# Patient Record
Sex: Female | Born: 1955 | Race: Black or African American | Hispanic: No | Marital: Married | Smoking: Never smoker
Health system: Southern US, Community
[De-identification: ages and names within clinical notes are randomized; demographics above are authoritative.]

## PROBLEM LIST (undated history)

## (undated) DIAGNOSIS — I10 Essential (primary) hypertension: Secondary | ICD-10-CM

## (undated) DIAGNOSIS — Z923 Personal history of irradiation: Secondary | ICD-10-CM

## (undated) DIAGNOSIS — Z9889 Other specified postprocedural states: Secondary | ICD-10-CM

## (undated) DIAGNOSIS — I82409 Acute embolism and thrombosis of unspecified deep veins of unspecified lower extremity: Secondary | ICD-10-CM

## (undated) DIAGNOSIS — R112 Nausea with vomiting, unspecified: Secondary | ICD-10-CM

## (undated) DIAGNOSIS — E7800 Pure hypercholesterolemia, unspecified: Secondary | ICD-10-CM

## (undated) DIAGNOSIS — K219 Gastro-esophageal reflux disease without esophagitis: Secondary | ICD-10-CM

## (undated) DIAGNOSIS — R011 Cardiac murmur, unspecified: Secondary | ICD-10-CM

## (undated) HISTORY — PX: COLONOSCOPY: SHX174

## (undated) HISTORY — PX: APPENDECTOMY: SHX54

## (undated) HISTORY — PX: BREAST LUMPECTOMY: SHX2

---

## 1998-12-13 ENCOUNTER — Observation Stay (HOSPITAL_COMMUNITY): Admission: EM | Admit: 1998-12-13 | Discharge: 1998-12-13 | Payer: Self-pay | Admitting: Emergency Medicine

## 1998-12-14 ENCOUNTER — Encounter: Payer: Self-pay | Admitting: Family Medicine

## 1998-12-15 ENCOUNTER — Encounter: Admission: RE | Admit: 1998-12-15 | Discharge: 1998-12-15 | Payer: Self-pay | Admitting: Family Medicine

## 1999-02-10 ENCOUNTER — Encounter: Payer: Self-pay | Admitting: Family Medicine

## 1999-02-10 ENCOUNTER — Encounter: Admission: RE | Admit: 1999-02-10 | Discharge: 1999-02-10 | Payer: Self-pay | Admitting: Family Medicine

## 2000-07-01 ENCOUNTER — Encounter: Payer: Self-pay | Admitting: Family Medicine

## 2000-07-01 ENCOUNTER — Encounter: Admission: RE | Admit: 2000-07-01 | Discharge: 2000-07-01 | Payer: Self-pay | Admitting: Family Medicine

## 2001-01-19 ENCOUNTER — Encounter: Payer: Self-pay | Admitting: Emergency Medicine

## 2001-01-19 ENCOUNTER — Inpatient Hospital Stay (HOSPITAL_COMMUNITY): Admission: EM | Admit: 2001-01-19 | Discharge: 2001-01-24 | Payer: Self-pay | Admitting: Emergency Medicine

## 2005-10-01 ENCOUNTER — Encounter: Admission: RE | Admit: 2005-10-01 | Discharge: 2005-10-01 | Payer: Self-pay | Admitting: Family Medicine

## 2005-10-11 ENCOUNTER — Encounter: Admission: RE | Admit: 2005-10-11 | Discharge: 2005-10-11 | Payer: Self-pay | Admitting: Family Medicine

## 2006-11-26 ENCOUNTER — Encounter: Admission: RE | Admit: 2006-11-26 | Discharge: 2006-11-26 | Payer: Self-pay | Admitting: Family Medicine

## 2007-03-17 ENCOUNTER — Other Ambulatory Visit: Admission: RE | Admit: 2007-03-17 | Discharge: 2007-03-17 | Payer: Self-pay | Admitting: Family Medicine

## 2008-03-18 ENCOUNTER — Encounter: Admission: RE | Admit: 2008-03-18 | Discharge: 2008-03-18 | Payer: Self-pay | Admitting: Family Medicine

## 2008-11-09 ENCOUNTER — Other Ambulatory Visit: Admission: RE | Admit: 2008-11-09 | Discharge: 2008-11-09 | Payer: Self-pay | Admitting: Family Medicine

## 2009-03-22 ENCOUNTER — Encounter: Admission: RE | Admit: 2009-03-22 | Discharge: 2009-03-22 | Payer: Self-pay | Admitting: Family Medicine

## 2009-12-12 ENCOUNTER — Other Ambulatory Visit: Admission: RE | Admit: 2009-12-12 | Discharge: 2009-12-12 | Payer: Self-pay | Admitting: Family Medicine

## 2010-03-23 ENCOUNTER — Encounter
Admission: RE | Admit: 2010-03-23 | Discharge: 2010-03-23 | Payer: Self-pay | Source: Home / Self Care | Attending: Family Medicine | Admitting: Family Medicine

## 2010-08-18 NOTE — Discharge Summary (Signed)
[place]  Patient:    Amber Jackson, Amber Jackson Visit Number: [id number] MRN: [id number]          Service Type: MED Location: [phone] Attending Physician:  Madaline Guthrie Admit Date:  01/19/2001 Discharge Date: 01/24/2001                             Discharge Summary  ADDENDUM  DISCHARGE LABORATORY DATA:  PT was 21.0, INR was 2.2, PTT was 156.  Sodium 140, potassium 4.0, chloride 106, bicarbonate 28, glucose 111, BUN 3, creatinine 1.4, calcium 9.2.  White blood cell count 8.6, hemoglobin 9.5, hematocrit 29.2, MCV 96.1, platelet count 344. Attending Physician:  Madaline Guthrie DD:  01/24/01 TD:  01/27/01 Job: [id number]

## 2010-08-18 NOTE — Discharge Summary (Signed)
. [facility]  Patient:    Amber Jackson, Amber Jackson Visit Number: [id number] MRN: [id number]          Service Type: MED Location: [phone] Attending Physician:  Madaline Guthrie Dictated by:   Marisue Brooklyn, M.D. Admit Date:  01/19/2001 Discharge Date: 01/24/2001   CC:         Osvaldo Human, M.D.  Dr. Charleston Poot in [place], [place].   Discharge Summary  PATIENT ADDRESS:  [address] [place], [place] [address]  PATIENT TELEPHONE NUMBER:  [phone]  DISCHARGE DIAGNOSES: 1. Bilateral pulmonary emboli. 2. Right femoral deep venous thrombosis.  CONDITION ON DISCHARGE:  Stable.  DISPOSITION:  To home.  FOLLOW-UP:  The patient will be followed up at the [facility] to have a PT and INR checked.  The patient will also be advised to make an appointment with her primary care physician in [place], Dr. Charleston Poot.  The patient must be followed for her Coumadin therapeutic level.  Would recommend checking a creatinine as it was elevated during her hospitalization.  HISTORY OF PRESENT ILLNESS:  The patient is a 55 year old African-American female who presented with a four day history of shortness of breath.  While in the emergency room, a CT scan of her chest was obtained.  CT scan showed bilateral pulmonary emboli.  A CT scan of her upper right thigh showed a superficial femoral DVT.  The patient had had an eight day history of a UTI prior to admission and was treated with Macrobid.  The patient had right CVA tenderness which resolved on Macrobid.  However, the patient continued to have costophrenic pain.  The patient denied recent surgery, travel or medications. Later it was determined that the patient had been taking oral contraceptive pills.  The patients mother has had a history of blood clot.  The patients son had had a clot; however, this was after breaking his ankle.  The patient has  had no leg swelling or erythema.  The patient has had no history of other clots.  The patient does not use alcohol, drugs or smoke.  The patient has no history of cancer and a normal mammogram in January 2002.  The patients last menstrual period was two weeks prior to admission.  HOSPITAL COURSE:  #1 - BILATERAL PULMONARY EMBOLI AND DEEP VENOUS THROMBOSIS:  The patient was treated with unfractionated heparin per pharmacy.  The patients PTT remained therapeutic during her six days of hospitalization.  On the first day of hospitalization, the patient was started on oral Coumadin.  The patients INR gradually evaluated to the day of discharge at 2.2.   The patients shortness of breath was improved during hospitalization.  The patient continued to have mild to moderate pain at her lateral ribs bilaterally.  However, pain was completely treated by Percocet and/or morphine.  #2 - HYPERTENSION:  The patients blood pressure was stable throughout her hospitalization.  The patient was maintained on Atenolol 2.5 mg p.o. q.d.  #3 - ELEVATED CREATININE:  The patients creatinine after the morning of hospitalization rose.  It peaked after day three of hospitalization at 2.0. It was believed this was most likely secondary to contrast nephropathy secondary to her CT obtained on day of admission.  On day three of admission, creatinine started to decrease to 1.7.  At discharge creatinine was 1.4.  DISCHARGE MEDICATIONS: 1. Claritin 10 mg p.o. q.d. 2. Rhinocort as directed. 3. Warfarin 7.5 mg p.o.  q.d. 4. Atenolol 12.5 mg p.o. q.d. 5. Lorazepam 1 mg p.o. q.h.s. p.r.n. insomnia. 6. Percocet 5/325 one to two tablets p.o. q.4-6h. p.r.n. pain.  PROCEDURES:  Chest x-ray on January 19, 2001 showed no evidence of acute cardiopulmonary disease.  An abdominal CT scan obtained on January 19, 2001 showed an unremarkable non-contrast abdomen.  CT scan of the pelvis on January 19, 2001 showed a filling defect in  the right superficial femoral vein which was consistent with a DVT.  Otherwise it was an unremarkable CT of the pelvis. CT scan of the chest on January 19, 2001 showed bilateral filling defects within the segmental branches of almost all pulmonary arterial segments and within the distal aspect of the right main and left main pulmonary arteries consistent with pulmonary emboli.  Minimal dependent and bibasilar atelectasis was noted.  No pleural effusions were noted.  Lawerance Sabal dictating for Marisue Brooklyn, M.D. Dictated by:   Marisue Brooklyn, M.D. Attending Physician:  Madaline Guthrie DD:  01/24/01 TD:  01/27/01 Job: [id number]

## 2010-12-21 ENCOUNTER — Other Ambulatory Visit (HOSPITAL_COMMUNITY)
Admission: RE | Admit: 2010-12-21 | Discharge: 2010-12-21 | Disposition: A | Discharge status: Home / Self Care | Payer: PRIVATE HEALTH INSURANCE | Source: Ambulatory Visit | Attending: Family Medicine | Admitting: Family Medicine

## 2010-12-21 DIAGNOSIS — Z124 Encounter for screening for malignant neoplasm of cervix: Secondary | ICD-10-CM | POA: Insufficient documentation

## 2011-03-12 ENCOUNTER — Other Ambulatory Visit: Payer: Self-pay | Admitting: Family Medicine

## 2011-03-12 DIAGNOSIS — Z1231 Encounter for screening mammogram for malignant neoplasm of breast: Secondary | ICD-10-CM

## 2011-03-30 ENCOUNTER — Inpatient Hospital Stay: Admission: RE | Admit: 2011-03-30 | Payer: PRIVATE HEALTH INSURANCE | Source: Ambulatory Visit

## 2011-04-02 ENCOUNTER — Ambulatory Visit
Admission: RE | Admit: 2011-04-02 | Discharge: 2011-04-02 | Disposition: A | Discharge status: Home / Self Care | Payer: PRIVATE HEALTH INSURANCE | Source: Ambulatory Visit | Attending: Family Medicine | Admitting: Family Medicine

## 2012-03-12 ENCOUNTER — Other Ambulatory Visit: Payer: Self-pay | Admitting: Family Medicine

## 2012-03-12 DIAGNOSIS — Z1231 Encounter for screening mammogram for malignant neoplasm of breast: Secondary | ICD-10-CM

## 2012-04-03 ENCOUNTER — Ambulatory Visit
Admission: RE | Admit: 2012-04-03 | Discharge: 2012-04-03 | Disposition: A | Discharge status: Home / Self Care | Payer: BC Managed Care – PPO | Source: Ambulatory Visit | Attending: Family Medicine | Admitting: Family Medicine

## 2012-04-03 DIAGNOSIS — Z1231 Encounter for screening mammogram for malignant neoplasm of breast: Secondary | ICD-10-CM

## 2013-03-16 ENCOUNTER — Other Ambulatory Visit: Payer: Self-pay

## 2013-03-16 DIAGNOSIS — Z1231 Encounter for screening mammogram for malignant neoplasm of breast: Secondary | ICD-10-CM

## 2013-04-22 ENCOUNTER — Ambulatory Visit
Admission: RE | Admit: 2013-04-22 | Discharge: 2013-04-22 | Disposition: A | Discharge status: Home / Self Care | Payer: BC Managed Care – PPO | Source: Ambulatory Visit

## 2013-04-22 DIAGNOSIS — Z1231 Encounter for screening mammogram for malignant neoplasm of breast: Secondary | ICD-10-CM

## 2014-01-08 ENCOUNTER — Other Ambulatory Visit (HOSPITAL_COMMUNITY)
Admission: RE | Admit: 2014-01-08 | Discharge: 2014-01-08 | Disposition: A | Discharge status: Home / Self Care | Payer: BC Managed Care – PPO | Source: Ambulatory Visit | Attending: Family Medicine | Admitting: Family Medicine

## 2014-01-08 ENCOUNTER — Other Ambulatory Visit: Payer: Self-pay | Admitting: Family Medicine

## 2014-01-08 DIAGNOSIS — Z124 Encounter for screening for malignant neoplasm of cervix: Secondary | ICD-10-CM | POA: Insufficient documentation

## 2014-01-12 LAB — CYTOLOGY - PAP

## 2014-04-07 ENCOUNTER — Other Ambulatory Visit: Payer: Self-pay

## 2014-04-07 DIAGNOSIS — Z1231 Encounter for screening mammogram for malignant neoplasm of breast: Secondary | ICD-10-CM

## 2014-04-23 ENCOUNTER — Ambulatory Visit: Payer: Commercial Managed Care - PPO

## 2014-04-29 ENCOUNTER — Ambulatory Visit
Admission: RE | Admit: 2014-04-29 | Discharge: 2014-04-29 | Disposition: A | Discharge status: Home / Self Care | Payer: BLUE CROSS/BLUE SHIELD | Source: Ambulatory Visit

## 2014-04-29 DIAGNOSIS — Z1231 Encounter for screening mammogram for malignant neoplasm of breast: Secondary | ICD-10-CM

## 2015-04-01 ENCOUNTER — Other Ambulatory Visit: Payer: Self-pay

## 2015-04-01 DIAGNOSIS — Z1231 Encounter for screening mammogram for malignant neoplasm of breast: Secondary | ICD-10-CM

## 2015-05-02 ENCOUNTER — Ambulatory Visit: Payer: BLUE CROSS/BLUE SHIELD

## 2015-05-03 ENCOUNTER — Ambulatory Visit
Admission: RE | Admit: 2015-05-03 | Discharge: 2015-05-03 | Disposition: A | Discharge status: Home / Self Care | Payer: BLUE CROSS/BLUE SHIELD | Source: Ambulatory Visit

## 2015-05-03 DIAGNOSIS — Z1231 Encounter for screening mammogram for malignant neoplasm of breast: Secondary | ICD-10-CM

## 2016-04-03 ENCOUNTER — Other Ambulatory Visit: Payer: Self-pay | Admitting: Family Medicine

## 2016-04-03 DIAGNOSIS — Z1231 Encounter for screening mammogram for malignant neoplasm of breast: Secondary | ICD-10-CM

## 2016-05-04 ENCOUNTER — Ambulatory Visit
Admission: RE | Admit: 2016-05-04 | Discharge: 2016-05-04 | Disposition: A | Discharge status: Home / Self Care | Payer: PRIVATE HEALTH INSURANCE | Source: Ambulatory Visit | Attending: Family Medicine | Admitting: Family Medicine

## 2016-05-04 DIAGNOSIS — Z1231 Encounter for screening mammogram for malignant neoplasm of breast: Secondary | ICD-10-CM

## 2017-01-28 ENCOUNTER — Other Ambulatory Visit (HOSPITAL_COMMUNITY)
Admission: RE | Admit: 2017-01-28 | Discharge: 2017-01-28 | Disposition: A | Discharge status: Home / Self Care | Payer: PRIVATE HEALTH INSURANCE | Source: Ambulatory Visit | Attending: Family Medicine | Admitting: Family Medicine

## 2017-01-28 ENCOUNTER — Other Ambulatory Visit: Payer: Self-pay | Admitting: Family Medicine

## 2017-01-28 DIAGNOSIS — Z01411 Encounter for gynecological examination (general) (routine) with abnormal findings: Secondary | ICD-10-CM | POA: Diagnosis not present

## 2017-01-30 LAB — CYTOLOGY - PAP
ADEQUACY: ABSENT
DIAGNOSIS: NEGATIVE

## 2017-04-05 ENCOUNTER — Other Ambulatory Visit: Payer: Self-pay | Admitting: Family Medicine

## 2017-04-05 DIAGNOSIS — Z139 Encounter for screening, unspecified: Secondary | ICD-10-CM

## 2017-05-06 ENCOUNTER — Ambulatory Visit
Admission: RE | Admit: 2017-05-06 | Discharge: 2017-05-06 | Disposition: A | Discharge status: Home / Self Care | Payer: PRIVATE HEALTH INSURANCE | Source: Ambulatory Visit | Attending: Family Medicine | Admitting: Family Medicine

## 2017-05-06 DIAGNOSIS — Z139 Encounter for screening, unspecified: Secondary | ICD-10-CM

## 2018-04-11 ENCOUNTER — Other Ambulatory Visit: Payer: Self-pay | Admitting: Family Medicine

## 2018-04-11 DIAGNOSIS — Z1231 Encounter for screening mammogram for malignant neoplasm of breast: Secondary | ICD-10-CM

## 2018-05-12 ENCOUNTER — Ambulatory Visit
Admission: RE | Admit: 2018-05-12 | Discharge: 2018-05-12 | Disposition: A | Discharge status: Home / Self Care | Payer: No Typology Code available for payment source | Source: Ambulatory Visit | Attending: Family Medicine | Admitting: Family Medicine

## 2018-05-12 DIAGNOSIS — Z1231 Encounter for screening mammogram for malignant neoplasm of breast: Secondary | ICD-10-CM

## 2019-04-23 ENCOUNTER — Other Ambulatory Visit: Payer: Self-pay | Admitting: Family Medicine

## 2019-04-23 DIAGNOSIS — Z1231 Encounter for screening mammogram for malignant neoplasm of breast: Secondary | ICD-10-CM

## 2019-05-22 ENCOUNTER — Ambulatory Visit: Payer: No Typology Code available for payment source

## 2019-06-23 ENCOUNTER — Ambulatory Visit: Payer: No Typology Code available for payment source

## 2019-08-10 ENCOUNTER — Other Ambulatory Visit: Payer: Self-pay

## 2019-08-10 ENCOUNTER — Ambulatory Visit
Admission: RE | Admit: 2019-08-10 | Discharge: 2019-08-10 | Disposition: A | Discharge status: Home / Self Care | Payer: No Typology Code available for payment source | Source: Ambulatory Visit | Attending: Family Medicine | Admitting: Family Medicine

## 2019-08-10 DIAGNOSIS — Z1231 Encounter for screening mammogram for malignant neoplasm of breast: Secondary | ICD-10-CM

## 2019-10-01 ENCOUNTER — Other Ambulatory Visit (HOSPITAL_COMMUNITY): Payer: Self-pay | Admitting: Family Medicine

## 2019-10-26 ENCOUNTER — Other Ambulatory Visit (HOSPITAL_COMMUNITY): Payer: Self-pay | Admitting: Family Medicine

## 2019-12-01 ENCOUNTER — Other Ambulatory Visit (HOSPITAL_COMMUNITY): Payer: Self-pay | Admitting: Family Medicine

## 2019-12-18 ENCOUNTER — Other Ambulatory Visit (HOSPITAL_COMMUNITY): Payer: Self-pay | Admitting: Family Medicine

## 2020-01-21 ENCOUNTER — Other Ambulatory Visit (HOSPITAL_COMMUNITY): Payer: Self-pay | Admitting: Family Medicine

## 2020-02-23 ENCOUNTER — Other Ambulatory Visit (HOSPITAL_COMMUNITY): Payer: Self-pay | Admitting: Family Medicine

## 2020-04-13 ENCOUNTER — Other Ambulatory Visit (HOSPITAL_COMMUNITY): Payer: Self-pay | Admitting: Internal Medicine

## 2020-04-13 ENCOUNTER — Ambulatory Visit: Payer: No Typology Code available for payment source | Attending: Internal Medicine

## 2020-04-13 DIAGNOSIS — Z23 Encounter for immunization: Secondary | ICD-10-CM

## 2020-04-13 NOTE — Progress Notes (Signed)
   Covid-19 Vaccination Clinic  Name:  CARMINE YOUNGBERG    MRN: [id number] DOB: 08-12-1955  04/13/2020  Ms. Gillispie was observed post Covid-19 immunization for 15 minutes without incident. She was provided with Vaccine Information Sheet and instruction to access the V-Safe system.   Ms. Haaland was instructed to call 911 with any severe reactions post vaccine: Marland Kitchen Difficulty breathing  . Swelling of face and throat  . A fast heartbeat  . A bad rash all over body  . Dizziness and weakness   Immunizations Administered    Name Date Dose VIS Date Route   Pfizer COVID-19 Vaccine 04/13/2020 12:05 PM 0.3 mL 01/20/2020 Intramuscular   Manufacturer: [place]   Lot: [id number]   NDC: [address]-7

## 2020-06-03 ENCOUNTER — Other Ambulatory Visit (HOSPITAL_COMMUNITY): Payer: Self-pay | Admitting: Family Medicine

## 2020-07-01 ENCOUNTER — Other Ambulatory Visit (HOSPITAL_COMMUNITY): Payer: Self-pay | Admitting: Family Medicine

## 2020-07-12 ENCOUNTER — Other Ambulatory Visit (HOSPITAL_COMMUNITY): Payer: Self-pay

## 2020-07-12 MED FILL — Ezetimibe Tab 10 MG: ORAL | 30 days supply | Qty: 30 | Fill #0 | Status: AC

## 2020-07-12 MED FILL — Atenolol Tab 25 MG: ORAL | 90 days supply | Qty: 90 | Fill #0 | Status: AC

## 2020-07-12 MED FILL — Ezetimibe Tab 10 MG: ORAL | 30 days supply | Qty: 30 | Fill #0 | Status: CN

## 2020-07-13 ENCOUNTER — Other Ambulatory Visit (HOSPITAL_COMMUNITY): Payer: Self-pay

## 2020-07-19 ENCOUNTER — Other Ambulatory Visit: Payer: Self-pay | Admitting: Family Medicine

## 2020-07-19 DIAGNOSIS — Z1231 Encounter for screening mammogram for malignant neoplasm of breast: Secondary | ICD-10-CM

## 2020-07-29 ENCOUNTER — Other Ambulatory Visit (HOSPITAL_COMMUNITY): Payer: Self-pay

## 2020-07-29 MED ORDER — HYDROCHLOROTHIAZIDE 12.5 MG PO CAPS
ORAL_CAPSULE | ORAL | 3 refills | Status: DC
  Filled 2020-07-29 – 2020-10-26 (×2): qty 90, 90d supply, fill #0
  Filled 2021-01-24: qty 90, 90d supply, fill #1
  Filled 2021-04-21: qty 90, 90d supply, fill #2

## 2020-08-02 ENCOUNTER — Other Ambulatory Visit (HOSPITAL_COMMUNITY): Payer: Self-pay

## 2020-08-05 ENCOUNTER — Other Ambulatory Visit (HOSPITAL_COMMUNITY): Payer: Self-pay

## 2020-08-11 ENCOUNTER — Other Ambulatory Visit (HOSPITAL_COMMUNITY): Payer: Self-pay

## 2020-08-11 MED FILL — Ezetimibe Tab 10 MG: ORAL | 30 days supply | Qty: 30 | Fill #1 | Status: AC

## 2020-08-26 ENCOUNTER — Other Ambulatory Visit (HOSPITAL_COMMUNITY): Payer: Self-pay

## 2020-08-26 MED FILL — Warfarin Sodium Tab 4 MG: ORAL | 84 days supply | Qty: 145 | Fill #0 | Status: AC

## 2020-08-31 ENCOUNTER — Other Ambulatory Visit (HOSPITAL_COMMUNITY): Payer: Self-pay

## 2020-08-31 MED FILL — Omeprazole Cap Delayed Release 40 MG: ORAL | 60 days supply | Qty: 60 | Fill #0 | Status: AC

## 2020-09-09 ENCOUNTER — Other Ambulatory Visit (HOSPITAL_COMMUNITY): Payer: Self-pay

## 2020-09-09 MED ORDER — EZETIMIBE 10 MG PO TABS
ORAL_TABLET | ORAL | 1 refills | Status: DC
  Filled 2020-09-09: qty 90, 90d supply, fill #0
  Filled 2020-12-06 (×2): qty 90, 90d supply, fill #1
  Filled 2020-12-06: qty 90, 90d supply, fill #0

## 2020-09-15 ENCOUNTER — Ambulatory Visit
Admission: RE | Admit: 2020-09-15 | Discharge: 2020-09-15 | Disposition: A | Discharge status: Home / Self Care | Payer: No Typology Code available for payment source | Source: Ambulatory Visit | Attending: Family Medicine | Admitting: Family Medicine

## 2020-09-15 ENCOUNTER — Other Ambulatory Visit: Payer: Self-pay

## 2020-09-15 DIAGNOSIS — Z1231 Encounter for screening mammogram for malignant neoplasm of breast: Secondary | ICD-10-CM

## 2020-10-07 ENCOUNTER — Other Ambulatory Visit (HOSPITAL_COMMUNITY): Payer: Self-pay

## 2020-10-07 MED ORDER — ATENOLOL 25 MG PO TABS
ORAL_TABLET | ORAL | 3 refills | Status: DC
  Filled 2020-10-07 – 2020-12-28 (×2): qty 90, 90d supply, fill #0
  Filled 2021-03-20: qty 90, 90d supply, fill #1
  Filled 2021-06-28: qty 90, 90d supply, fill #2

## 2020-10-26 ENCOUNTER — Other Ambulatory Visit (HOSPITAL_COMMUNITY): Payer: Self-pay

## 2020-10-26 MED FILL — Omeprazole Cap Delayed Release 40 MG: ORAL | 60 days supply | Qty: 60 | Fill #0 | Status: AC

## 2020-11-23 ENCOUNTER — Other Ambulatory Visit (HOSPITAL_COMMUNITY): Payer: Self-pay

## 2020-11-23 MED FILL — Warfarin Sodium Tab 4 MG: ORAL | 84 days supply | Qty: 145 | Fill #1 | Status: CN

## 2020-11-23 MED FILL — Warfarin Sodium Tab 4 MG: ORAL | 84 days supply | Qty: 145 | Fill #0 | Status: AC

## 2020-11-24 ENCOUNTER — Other Ambulatory Visit (HOSPITAL_COMMUNITY): Payer: Self-pay

## 2020-12-06 ENCOUNTER — Other Ambulatory Visit (HOSPITAL_COMMUNITY): Payer: Self-pay

## 2020-12-07 ENCOUNTER — Other Ambulatory Visit (HOSPITAL_COMMUNITY): Payer: Self-pay

## 2020-12-28 ENCOUNTER — Other Ambulatory Visit (HOSPITAL_COMMUNITY): Payer: Self-pay

## 2020-12-28 MED FILL — Omeprazole Cap Delayed Release 40 MG: ORAL | 60 days supply | Qty: 60 | Fill #1 | Status: AC

## 2021-01-24 ENCOUNTER — Other Ambulatory Visit (HOSPITAL_COMMUNITY): Payer: Self-pay

## 2021-01-24 MED ORDER — CYCLOBENZAPRINE HCL 10 MG PO TABS: 10.0000 mg | ORAL_TABLET | Freq: Every evening | ORAL | 0 refills | Status: DC | PRN

## 2021-01-24 MED ORDER — CYCLOBENZAPRINE HCL 10 MG PO TABS
10.0000 mg | ORAL_TABLET | Freq: Every evening | ORAL | 1 refills | Status: AC | PRN
  Filled 2021-01-24: qty 90, 90d supply, fill #0

## 2021-01-24 MED FILL — Cetirizine HCl Tab 10 MG: ORAL | 90 days supply | Qty: 90 | Fill #0 | Status: CN

## 2021-02-16 ENCOUNTER — Other Ambulatory Visit (HOSPITAL_COMMUNITY): Payer: Self-pay

## 2021-02-16 MED ORDER — WARFARIN SODIUM 4 MG PO TABS
ORAL_TABLET | ORAL | 5 refills | Status: AC
  Filled 2021-02-16: qty 145, 84d supply, fill #0
  Filled 2021-02-16: qty 141, 82d supply, fill #0
  Filled 2021-02-16: qty 4, 2d supply, fill #0
  Filled 2021-05-05: qty 145, 84d supply, fill #0
  Filled 2021-07-28: qty 145, 84d supply, fill #1
  Filled 2021-10-23: qty 145, 84d supply, fill #0
  Filled 2022-01-17: qty 145, 84d supply, fill #1

## 2021-02-17 ENCOUNTER — Other Ambulatory Visit (HOSPITAL_COMMUNITY): Payer: Self-pay

## 2021-02-28 ENCOUNTER — Other Ambulatory Visit (HOSPITAL_COMMUNITY): Payer: Self-pay

## 2021-02-28 MED FILL — Omeprazole Cap Delayed Release 40 MG: ORAL | 60 days supply | Qty: 60 | Fill #2 | Status: AC

## 2021-03-20 ENCOUNTER — Other Ambulatory Visit (HOSPITAL_COMMUNITY): Payer: Self-pay

## 2021-04-02 DIAGNOSIS — C50919 Malignant neoplasm of unspecified site of unspecified female breast: Secondary | ICD-10-CM

## 2021-04-02 HISTORY — DX: Malignant neoplasm of unspecified site of unspecified female breast: C50.919

## 2021-04-09 IMAGING — MG DIGITAL SCREENING BILAT W/ TOMO W/ CAD
6 of 10 series · 6 of 30 positions shown · non-contrast
Comparison: Previous exam(s).

CLINICAL DATA: Screening.

EXAM:
DIGITAL SCREENING BILATERAL MAMMOGRAM WITH TOMO AND CAD

[L MLO synth-2D]
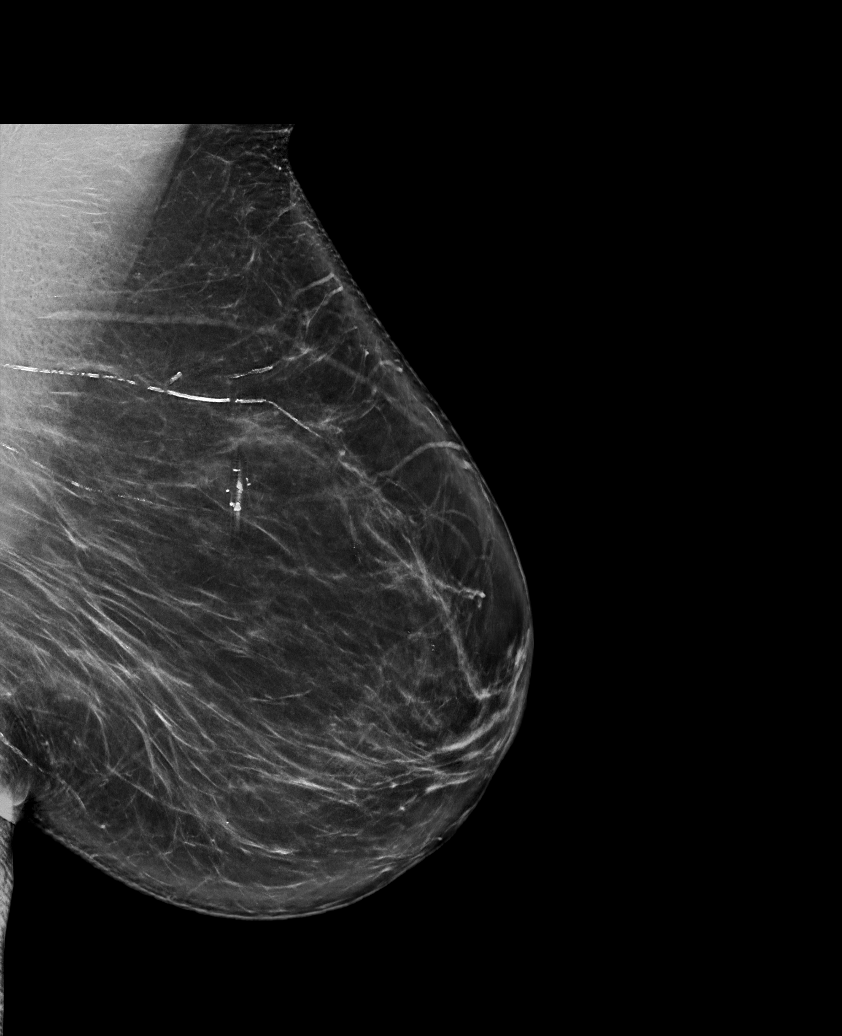

[R CC synth-2D]
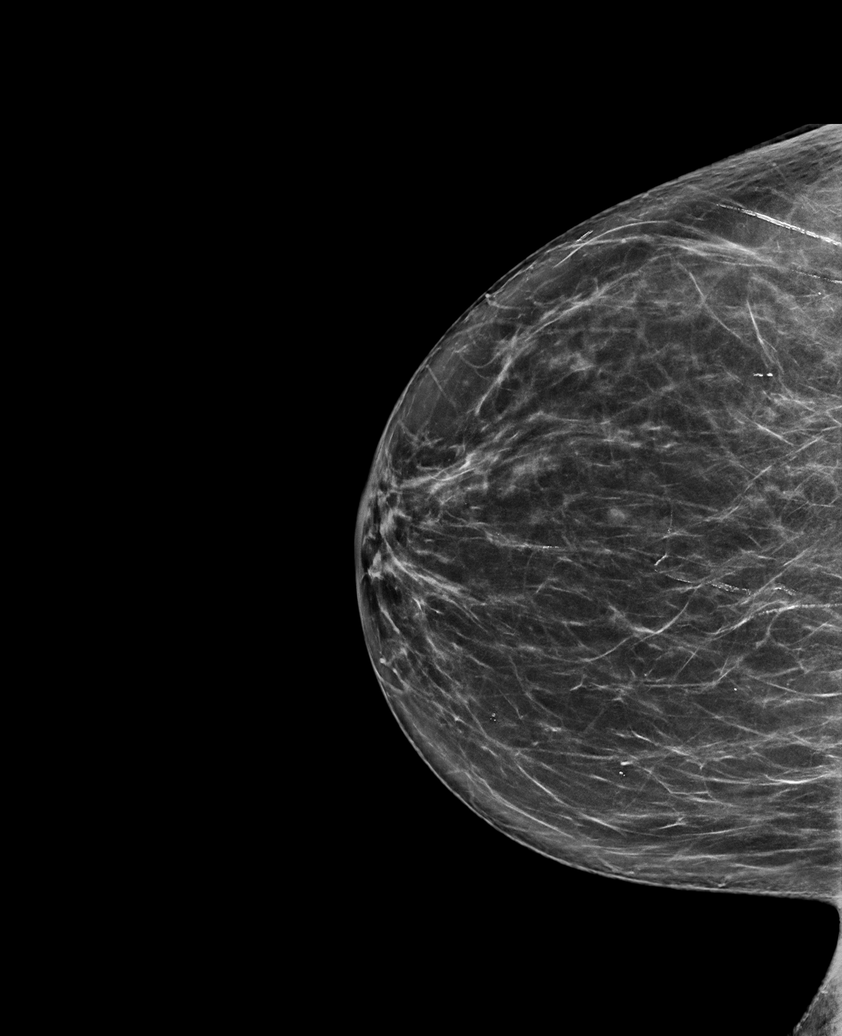

[R MLO synth-2D (1 of 2)]
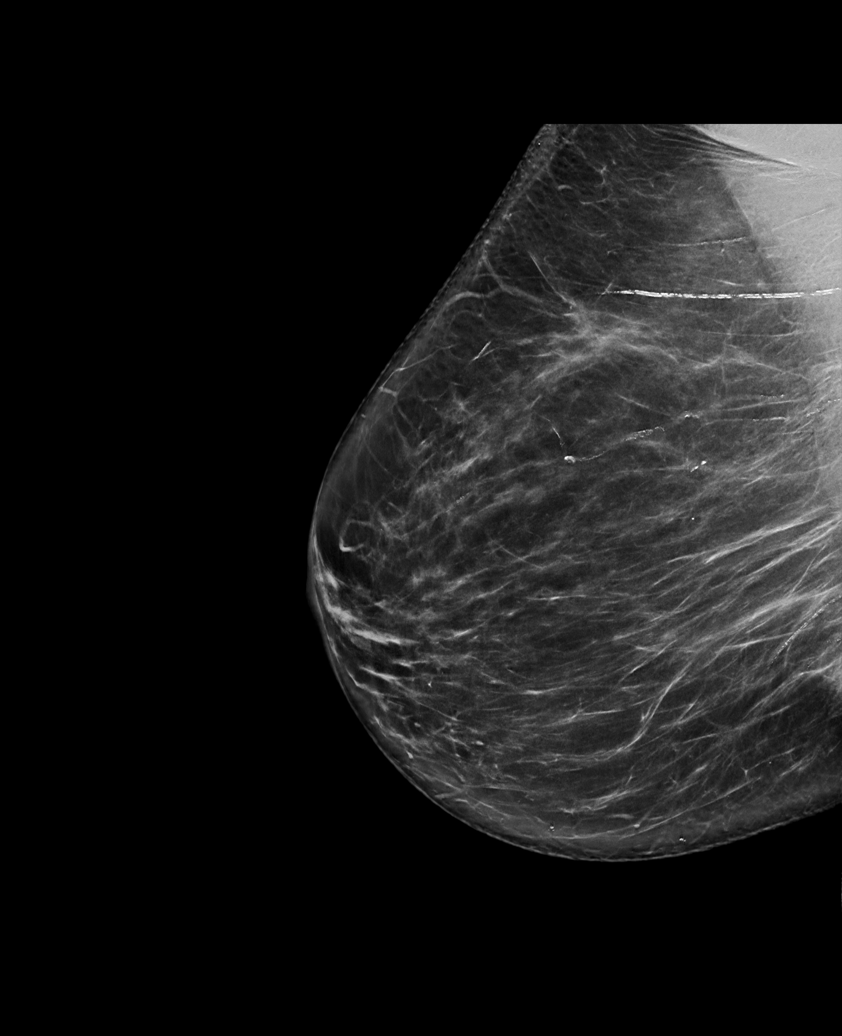

[L CC synth-2D]
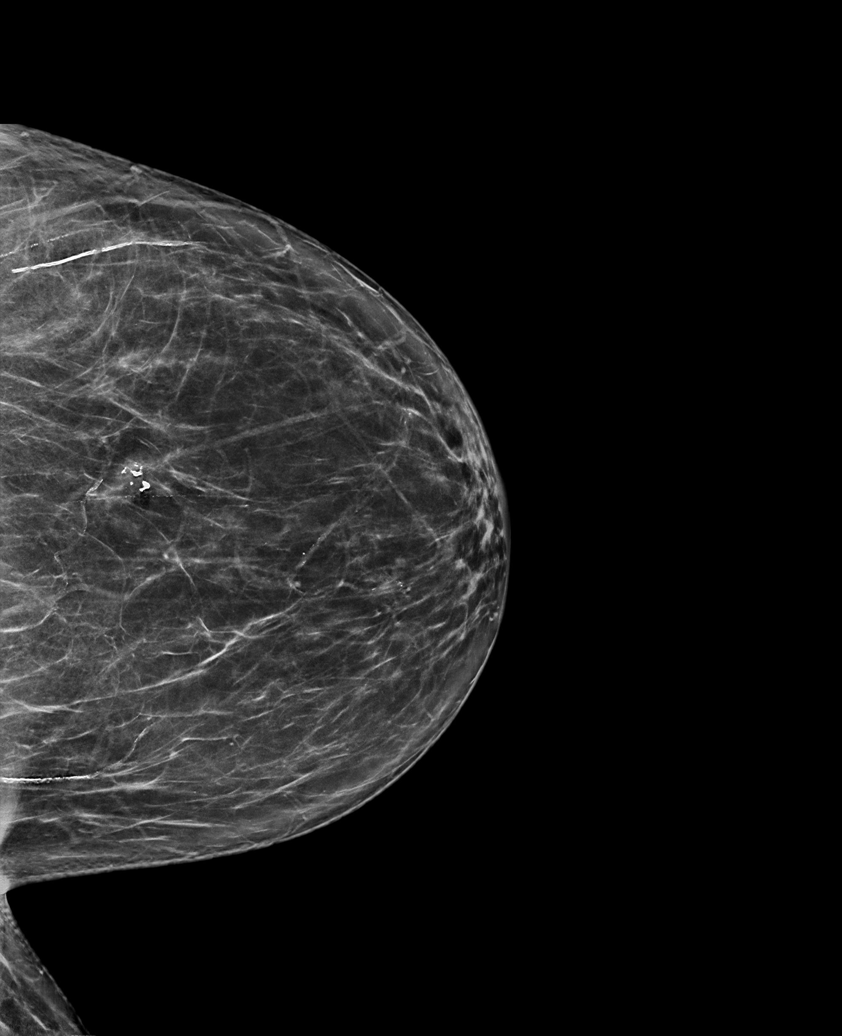

[R MLO synth-2D (2 of 2)]
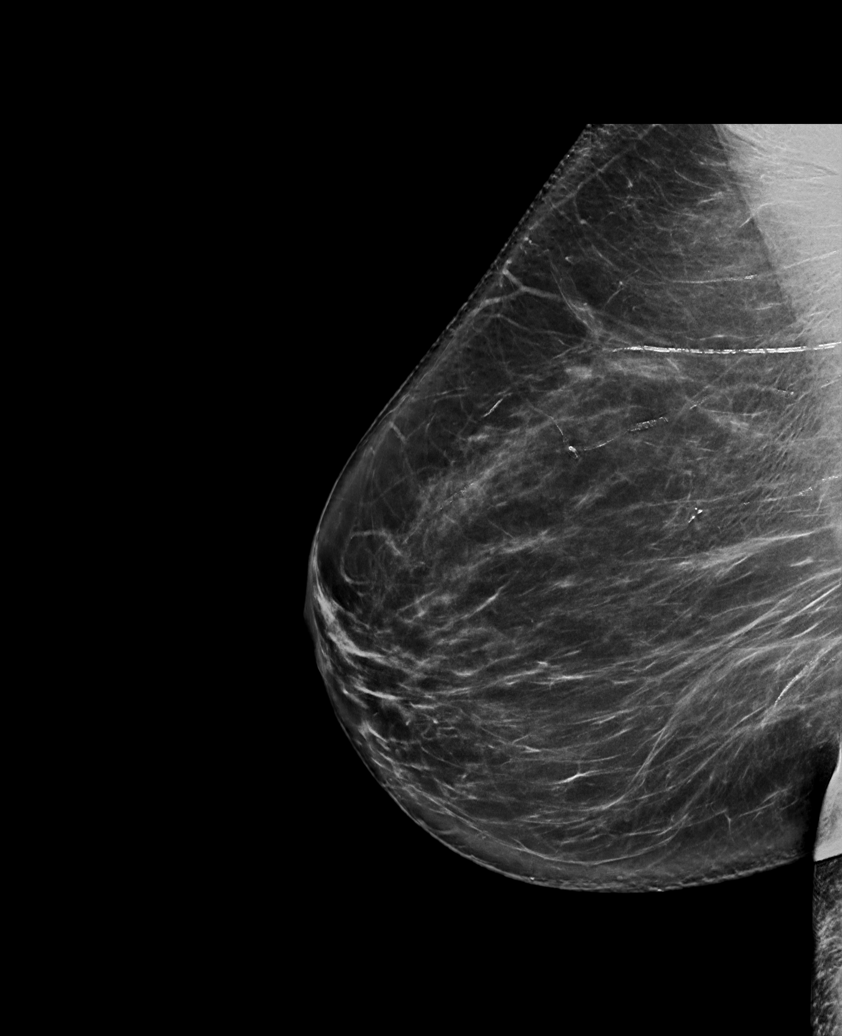

[R CC tomo · tomo slice 39/78.0]
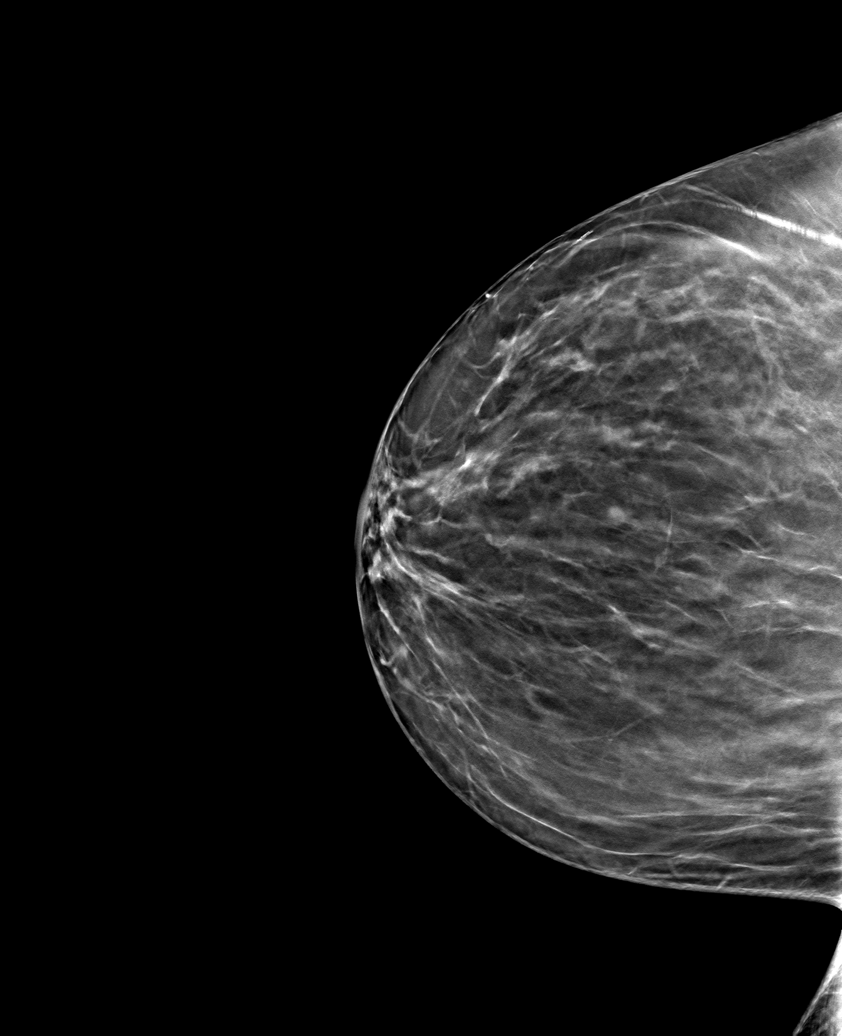

[6 of 30 positions shown; findings below may reference images not displayed]

ACR Breast Density Category b: There are scattered areas of
fibroglandular density.
FINDINGS: There are no findings suspicious for malignancy. Images were
processed with CAD.
IMPRESSION: No mammographic evidence of malignancy. A result letter of this
screening mammogram will be mailed directly to the patient.

RECOMMENDATION:
Screening mammogram in one year. (Code:[id number])

BI-RADS CATEGORY  1: Negative.

## 2021-04-11 ENCOUNTER — Other Ambulatory Visit: Payer: Self-pay

## 2021-04-11 MED ORDER — EZETIMIBE 10 MG PO TABS
ORAL_TABLET | ORAL | 1 refills | Status: DC
  Filled 2021-04-11: qty 90, 90d supply, fill #0
  Filled 2021-06-28: qty 90, 90d supply, fill #1

## 2021-04-21 ENCOUNTER — Other Ambulatory Visit: Payer: Self-pay

## 2021-04-27 ENCOUNTER — Other Ambulatory Visit: Payer: Self-pay

## 2021-04-27 MED FILL — Omeprazole Cap Delayed Release 40 MG: ORAL | 60 days supply | Qty: 60 | Fill #3 | Status: AC

## 2021-05-05 ENCOUNTER — Other Ambulatory Visit: Payer: Self-pay

## 2021-06-28 ENCOUNTER — Other Ambulatory Visit: Payer: Self-pay

## 2021-06-28 MED FILL — Omeprazole Cap Delayed Release 40 MG: ORAL | 60 days supply | Qty: 60 | Fill #4 | Status: AC

## 2021-06-29 ENCOUNTER — Other Ambulatory Visit: Payer: Self-pay

## 2021-07-27 ENCOUNTER — Other Ambulatory Visit: Payer: Self-pay

## 2021-07-27 MED ORDER — HYDROCHLOROTHIAZIDE 12.5 MG PO CAPS
ORAL_CAPSULE | ORAL | 2 refills | Status: DC
  Filled 2021-07-27: qty 90, 90d supply, fill #0

## 2021-07-27 MED ORDER — HYDROCHLOROTHIAZIDE 12.5 MG PO CAPS
ORAL_CAPSULE | ORAL | 1 refills | Status: AC
  Filled 2021-07-27 – 2021-10-23 (×2): qty 90, 90d supply, fill #0
  Filled 2022-01-17: qty 90, 90d supply, fill #1

## 2021-07-28 ENCOUNTER — Other Ambulatory Visit: Payer: Self-pay

## 2021-07-31 ENCOUNTER — Other Ambulatory Visit: Payer: Self-pay

## 2021-08-22 ENCOUNTER — Other Ambulatory Visit: Payer: Self-pay | Admitting: Family Medicine

## 2021-08-22 DIAGNOSIS — Z1231 Encounter for screening mammogram for malignant neoplasm of breast: Secondary | ICD-10-CM

## 2021-08-25 ENCOUNTER — Other Ambulatory Visit: Payer: Self-pay

## 2021-08-25 MED ORDER — OMEPRAZOLE 40 MG PO CPDR
DELAYED_RELEASE_CAPSULE | Freq: Two times a day (BID) | ORAL | 3 refills | Status: AC
  Filled 2021-08-25: qty 60, 30d supply, fill #0
  Filled 2021-10-31: qty 60, 30d supply, fill #1
  Filled 2021-11-01 – 2021-11-02 (×2): qty 60, 30d supply, fill #0
  Filled 2021-12-25: qty 60, 30d supply, fill #1
  Filled 2022-02-26: qty 60, 30d supply, fill #2

## 2021-08-30 ENCOUNTER — Other Ambulatory Visit: Payer: Self-pay

## 2021-09-18 ENCOUNTER — Ambulatory Visit
Admission: RE | Admit: 2021-09-18 | Discharge: 2021-09-18 | Disposition: A | Discharge status: Home / Self Care | Payer: No Typology Code available for payment source | Source: Ambulatory Visit | Attending: Family Medicine | Admitting: Family Medicine

## 2021-09-18 DIAGNOSIS — Z1231 Encounter for screening mammogram for malignant neoplasm of breast: Secondary | ICD-10-CM

## 2021-09-20 ENCOUNTER — Other Ambulatory Visit: Payer: Self-pay | Admitting: Family Medicine

## 2021-09-20 DIAGNOSIS — R928 Other abnormal and inconclusive findings on diagnostic imaging of breast: Secondary | ICD-10-CM

## 2021-09-22 ENCOUNTER — Other Ambulatory Visit: Payer: Self-pay

## 2021-09-22 MED ORDER — ATENOLOL 25 MG PO TABS
ORAL_TABLET | ORAL | 0 refills | Status: DC
Start: 2021-09-22 — End: 2021-12-14
  Filled 2021-09-22: qty 90, 90d supply, fill #0

## 2021-09-25 ENCOUNTER — Other Ambulatory Visit: Payer: Self-pay

## 2021-09-26 ENCOUNTER — Other Ambulatory Visit: Payer: Self-pay | Admitting: Family Medicine

## 2021-09-26 ENCOUNTER — Ambulatory Visit
Admission: RE | Admit: 2021-09-26 | Discharge: 2021-09-26 | Disposition: A | Discharge status: Home / Self Care | Payer: No Typology Code available for payment source | Source: Ambulatory Visit | Attending: Family Medicine | Admitting: Family Medicine

## 2021-09-26 DIAGNOSIS — R921 Mammographic calcification found on diagnostic imaging of breast: Secondary | ICD-10-CM

## 2021-09-26 DIAGNOSIS — R928 Other abnormal and inconclusive findings on diagnostic imaging of breast: Secondary | ICD-10-CM

## 2021-10-02 ENCOUNTER — Ambulatory Visit
Admission: RE | Admit: 2021-10-02 | Discharge: 2021-10-02 | Disposition: A | Discharge status: Home / Self Care | Payer: No Typology Code available for payment source | Source: Ambulatory Visit | Attending: Family Medicine | Admitting: Family Medicine

## 2021-10-02 DIAGNOSIS — R921 Mammographic calcification found on diagnostic imaging of breast: Secondary | ICD-10-CM

## 2021-10-10 ENCOUNTER — Other Ambulatory Visit: Payer: Self-pay

## 2021-10-10 ENCOUNTER — Other Ambulatory Visit (HOSPITAL_COMMUNITY): Payer: Self-pay

## 2021-10-10 MED ORDER — EZETIMIBE 10 MG PO TABS
10.0000 mg | ORAL_TABLET | Freq: Every day | ORAL | 2 refills | Status: DC
Start: 2021-10-10 — End: 2022-07-24
  Filled 2021-10-10: qty 90, 90d supply, fill #0
  Filled 2022-01-17: qty 90, 90d supply, fill #1
  Filled 2022-04-11: qty 90, 90d supply, fill #2

## 2021-10-23 ENCOUNTER — Other Ambulatory Visit (HOSPITAL_COMMUNITY): Payer: Self-pay

## 2021-10-24 ENCOUNTER — Other Ambulatory Visit (HOSPITAL_COMMUNITY): Payer: Self-pay

## 2021-10-30 ENCOUNTER — Other Ambulatory Visit: Payer: Self-pay | Admitting: General Surgery

## 2021-10-30 DIAGNOSIS — D6851 Activated protein C resistance: Secondary | ICD-10-CM | POA: Insufficient documentation

## 2021-10-30 DIAGNOSIS — N183 Chronic kidney disease, stage 3 unspecified: Secondary | ICD-10-CM | POA: Insufficient documentation

## 2021-10-30 DIAGNOSIS — Z86711 Personal history of pulmonary embolism: Secondary | ICD-10-CM | POA: Insufficient documentation

## 2021-10-30 DIAGNOSIS — R921 Mammographic calcification found on diagnostic imaging of breast: Secondary | ICD-10-CM

## 2021-10-30 DIAGNOSIS — Z7901 Long term (current) use of anticoagulants: Secondary | ICD-10-CM | POA: Insufficient documentation

## 2021-10-31 ENCOUNTER — Other Ambulatory Visit (HOSPITAL_COMMUNITY): Payer: Self-pay

## 2021-10-31 ENCOUNTER — Other Ambulatory Visit: Payer: Self-pay

## 2021-10-31 MED ORDER — OMEPRAZOLE 40 MG PO CPDR
40.0000 mg | DELAYED_RELEASE_CAPSULE | Freq: Two times a day (BID) | ORAL | 3 refills | Status: DC
  Filled 2021-10-31 – 2021-11-01 (×2): qty 60, 30d supply, fill #0

## 2021-10-31 MED ORDER — ENOXAPARIN SODIUM 40 MG/0.4ML IJ SOSY
PREFILLED_SYRINGE | INTRAMUSCULAR | 0 refills | Status: DC
  Filled 2021-10-31: qty 2.8, 7d supply, fill #0

## 2021-11-01 ENCOUNTER — Other Ambulatory Visit (HOSPITAL_COMMUNITY): Payer: Self-pay

## 2021-11-01 ENCOUNTER — Other Ambulatory Visit: Payer: Self-pay

## 2021-11-02 ENCOUNTER — Other Ambulatory Visit: Payer: Self-pay

## 2021-11-02 ENCOUNTER — Other Ambulatory Visit (HOSPITAL_COMMUNITY): Payer: Self-pay

## 2021-11-02 ENCOUNTER — Other Ambulatory Visit: Payer: Self-pay | Admitting: General Surgery

## 2021-11-02 DIAGNOSIS — R921 Mammographic calcification found on diagnostic imaging of breast: Secondary | ICD-10-CM

## 2021-11-03 ENCOUNTER — Other Ambulatory Visit: Payer: Self-pay

## 2021-11-22 NOTE — Pre-Procedure Instructions (Signed)
Surgical Instructions    Your procedure is scheduled on Thursday 11/30/21.   Report to [facility] Main Entrance "A" at 05:30 A.M., then check in with the Admitting office.  Call this number if you have problems the morning of surgery:  [phone]   If you have any questions prior to your surgery date call [phone]: Open Monday-Friday 8am-4pm    Remember:  Do not eat after midnight the night before your surgery  You may drink clear liquids until 04:30 A.M. the morning of your surgery.   Clear liquids allowed are: Water, Non-Citrus Juices (without pulp), Carbonated Beverages, Clear Tea, Black Coffee ONLY (NO MILK, CREAM OR POWDERED CREAMER of any kind), and Gatorade    Take these medicines the morning of surgery with A SIP OF WATER:   atenolol (TENORMIN)  cetirizine (ZYRTEC)   ezetimibe (ZETIA)   omeprazole (PRILOSEC)    Take these medicines if needed:   cyclobenzaprine (FLEXERIL)   Please follow your surgeon's instructions regarding warfarin (COUMADIN) and enoxaparin (LOVENOX). If you have not received instructions then please contact your surgeon's office for instructions.   As of today, STOP taking any Aspirin (unless otherwise instructed by your surgeon) Aleve, Naproxen, Ibuprofen, Motrin, Advil, Goody's, BC's, all herbal medications, fish oil, and all vitamins.           Do not wear jewelry or makeup. Do not wear lotions, powders, perfumes/cologne or deodorant. Do not shave 48 hours prior to surgery.  Men may shave face and neck. Do not bring valuables to the hospital. Do not wear nail polish, gel polish, artificial nails, or any other type of covering on natural nails (fingers and toes) If you have artificial nails or gel coating that need to be removed by a nail salon, please have this removed prior to surgery. Artificial nails or gel coating may interfere with anesthesia's ability to adequately monitor your vital signs.  [facility] is not responsible for any  belongings or valuables.    Do NOT Smoke (Tobacco/Vaping)  24 hours prior to your procedure  If you use a CPAP at night, you may bring your mask for your overnight stay.   Contacts, glasses, hearing aids, dentures or partials may not be worn into surgery, please bring cases for these belongings   For patients admitted to the hospital, discharge time will be determined by your treatment team.   Patients discharged the day of surgery will not be allowed to drive home, and someone needs to stay with them for 24 hours.   SURGICAL WAITING ROOM VISITATION Patients having surgery or a procedure may have no more than 2 support people in the waiting area - these visitors may rotate.   Children under the age of 29 must have an adult with them who is not the patient. If the patient needs to stay at the hospital during part of their recovery, the visitor guidelines for inpatient rooms apply. Pre-op nurse will coordinate an appropriate time for 1 support person to accompany patient in pre-op.  This support person may not rotate.   Please refer to the [facility] website for the visitor guidelines for Inpatients (after your surgery is over and you are in a regular room).    Special instructions:    Oral Hygiene is also important to reduce your risk of infection.  Remember - BRUSH YOUR TEETH THE MORNING OF SURGERY WITH YOUR REGULAR TOOTHPASTE   [facility]- Preparing For Surgery  Before surgery, you can play an important role. Because  skin is not sterile, your skin needs to be as free of germs as possible. You can reduce the number of germs on your skin by washing with CHG (chlorahexidine gluconate) Soap before surgery.  CHG is an antiseptic cleaner which kills germs and bonds with the skin to continue killing germs even after washing.     Please do not use if you have an allergy to CHG or antibacterial soaps. If your skin becomes reddened/irritated stop using the CHG.  Do not shave (including legs  and underarms) for at least 48 hours prior to first CHG shower. It is OK to shave your face.  Please follow these instructions carefully.     Shower the NIGHT BEFORE SURGERY and the MORNING OF SURGERY with CHG Soap.   If you chose to wash your hair, wash your hair first as usual with your normal shampoo. After you shampoo, rinse your hair and body thoroughly to remove the shampoo.  Then [employer] and genitals (private parts) with your normal soap and rinse thoroughly to remove soap.  After that Use CHG Soap as you would any other liquid soap. You can apply CHG directly to the skin and wash gently with a scrungie or a clean washcloth.   Apply the CHG Soap to your body ONLY FROM THE NECK DOWN.  Do not use on open wounds or open sores. Avoid contact with your eyes, ears, mouth and genitals (private parts). Wash Face and genitals (private parts)  with your normal soap.   Wash thoroughly, paying special attention to the area where your surgery will be performed.  Thoroughly rinse your body with warm water from the neck down.  DO NOT shower/wash with your normal soap after using and rinsing off the CHG Soap.  Pat yourself dry with a CLEAN TOWEL.  Wear CLEAN PAJAMAS to bed the night before surgery  Place CLEAN SHEETS on your bed the night before your surgery  DO NOT SLEEP WITH PETS.   Day of Surgery:  Take a shower with CHG soap. Wear Clean/Comfortable clothing the morning of surgery Do not apply any deodorants/lotions.   Remember to brush your teeth WITH YOUR REGULAR TOOTHPASTE.    If you received a COVID test during your pre-op visit, it is requested that you wear a mask when out in public, stay away from anyone that may not be feeling well, and notify your surgeon if you develop symptoms. If you have been in contact with anyone that has tested positive in the last 10 days, please notify your surgeon.    Please read over the following fact sheets that you were given.

## 2021-11-23 ENCOUNTER — Other Ambulatory Visit: Payer: Self-pay

## 2021-11-23 ENCOUNTER — Encounter (HOSPITAL_COMMUNITY): Payer: Self-pay

## 2021-11-23 ENCOUNTER — Encounter (HOSPITAL_COMMUNITY)
Admission: RE | Admit: 2021-11-23 | Discharge: 2021-11-23 | Disposition: A | Discharge status: Home / Self Care | Payer: No Typology Code available for payment source | Source: Ambulatory Visit | Attending: General Surgery | Admitting: General Surgery

## 2021-11-23 VITALS — BP 150/82 | HR 75 | Temp 98.2°F | Resp 17 | Ht 63.0 in | Wt 175.6 lb

## 2021-11-23 DIAGNOSIS — Z01818 Encounter for other preprocedural examination: Secondary | ICD-10-CM | POA: Diagnosis present

## 2021-11-23 DIAGNOSIS — Z86718 Personal history of other venous thrombosis and embolism: Secondary | ICD-10-CM | POA: Insufficient documentation

## 2021-11-23 DIAGNOSIS — N183 Chronic kidney disease, stage 3 unspecified: Secondary | ICD-10-CM | POA: Insufficient documentation

## 2021-11-23 DIAGNOSIS — I083 Combined rheumatic disorders of mitral, aortic and tricuspid valves: Secondary | ICD-10-CM | POA: Diagnosis not present

## 2021-11-23 DIAGNOSIS — Z7901 Long term (current) use of anticoagulants: Secondary | ICD-10-CM | POA: Diagnosis not present

## 2021-11-23 DIAGNOSIS — I2510 Atherosclerotic heart disease of native coronary artery without angina pectoris: Secondary | ICD-10-CM | POA: Diagnosis not present

## 2021-11-23 DIAGNOSIS — I10 Essential (primary) hypertension: Secondary | ICD-10-CM | POA: Insufficient documentation

## 2021-11-23 HISTORY — DX: Pure hypercholesterolemia, unspecified: E78.00

## 2021-11-23 HISTORY — DX: Essential (primary) hypertension: I10

## 2021-11-23 HISTORY — DX: Nausea with vomiting, unspecified: R11.2

## 2021-11-23 HISTORY — DX: Acute embolism and thrombosis of unspecified deep veins of unspecified lower extremity: I82.409

## 2021-11-23 HISTORY — DX: Cardiac murmur, unspecified: R01.1

## 2021-11-23 HISTORY — DX: Gastro-esophageal reflux disease without esophagitis: K21.9

## 2021-11-23 HISTORY — DX: Other specified postprocedural states: Z98.890

## 2021-11-23 LAB — BASIC METABOLIC PANEL
Anion gap: 6 (ref 5–15)
BUN: 23 mg/dL (ref 8–23)
CO2: 29 mmol/L (ref 22–32)
Calcium: 9.8 mg/dL (ref 8.9–10.3)
Chloride: 104 mmol/L (ref 98–111)
Creatinine, Ser: 1.39 mg/dL — ABNORMAL HIGH (ref 0.44–1.00)
GFR, Estimated: 42 mL/min — ABNORMAL LOW (ref >60)
Glucose, Bld: 107 mg/dL — ABNORMAL HIGH (ref 70–99)
Potassium: 3.6 mmol/L (ref 3.5–5.1)
Sodium: 139 mmol/L (ref 135–145)

## 2021-11-23 LAB — CBC
HCT: 40.3 % (ref 36.0–46.0)
Hemoglobin: 13.1 g/dL (ref 12.0–15.0)
MCH: 30.5 pg (ref 26.0–34.0)
MCHC: 32.5 g/dL (ref 30.0–36.0)
MCV: 93.9 fL (ref 80.0–100.0)
Platelets: 286 10*3/uL (ref 150–400)
RBC: 4.29 MIL/uL (ref 3.87–5.11)
RDW: 12.8 % (ref 11.5–15.5)
WBC: 7.9 10*3/uL (ref 4.0–10.5)
nRBC: 0 % (ref 0.0–0.2)

## 2021-11-23 NOTE — Anesthesia Preprocedure Evaluation (Addendum)
Anesthesia Evaluation  Patient identified by MRN, date of birth, ID band Patient awake    Reviewed: Allergy & Precautions, H&P , NPO status , Patient's Chart, lab work & pertinent test results  History of Anesthesia Complications (+) PONV and history of anesthetic complications  Airway Mallampati: II   Neck ROM: full    Dental   Pulmonary neg pulmonary ROS,    breath sounds clear to auscultation       Cardiovascular hypertension, + DVT   Rhythm:regular Rate:Normal     Neuro/Psych    GI/Hepatic GERD  ,  Endo/Other    Renal/GU      Musculoskeletal   Abdominal   Peds  Hematology   Anesthesia Other Findings   Reproductive/Obstetrics                            Anesthesia Physical Anesthesia Plan  ASA: 2  Anesthesia Plan: General   Post-op Pain Management:    Induction: Intravenous  PONV Risk Score and Plan: 4 or greater and Ondansetron, Dexamethasone, Midazolam and Treatment may vary due to age or medical condition  Airway Management Planned: LMA  Additional Equipment:   Intra-op Plan:   Post-operative Plan: Extubation in OR  Informed Consent: I have reviewed the patients History and Physical, chart, labs and discussed the procedure including the risks, benefits and alternatives for the proposed anesthesia with the patient or authorized representative who has indicated his/her understanding and acceptance.     Dental advisory given  Plan Discussed with: CRNA, Anesthesiologist and Surgeon  Anesthesia Plan Comments: (PAT note by Karoline Caldwell, PA-C: Patient states she was evaluated by cardiologist Dr. Daneen Schick in 2010 for hypertension and heart murmur.  Echo 09/01/2008 showed normal LV size and function, mild TR. she states that Dr. Tamala Julian advised her no additional cardiology follow-up is necessary.  Hypertension has subsequently been followed by PCP Dr. Laurann Montana at [place].  No murmur  appreciated on exam today.  She has good functional status, can climb 2 flights of stairs without cardiopulmonary symptoms.  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  Patient reports last dose 11/25/2021.  History of CKD 3 per PCP notes.  Preop labs reviewed, creatinine mildly elevated 1.39, otherwise unremarkable.  EKG done today shows NSR at rate of 62 bpm with LVH and diffuse T wave inversions.  Appears unchanged compared with tracing from 05/21/2010 from PCP office (copy on patient's chart).  TTE 09/01/2008 (copy on chart): Conclusions: 1.  Normal LV size and function. 2.  Trace mitral valve regurgitation. 3.  There is mild tricuspid valve regurgitation. 4.  Trace aortic valve regurgitation. 5.  Normal 2D, M-mode and color Doppler echocardiogram.  )       Anesthesia Quick Evaluation

## 2021-11-23 NOTE — Pre-Procedure Instructions (Signed)
PCP - Kelton Pillar  Cardiologist - denies current cardiologist- saw Dr. Daneen Schick at [facility] in 2010, and was told that she did not need follow up.   PPM/ICD - denies  Chest x-ray - N/A EKG - 11/23/2021  Stress Test - denies ECHO - 2010- pt reports his was normal Cardiac Cath - denies  Sleep Study - denies  Blood Thinner Instructions:Last dose of Coumadin will be 11/25/21, patient will take Lovo- will need labs DOS  ERAS Protcol - ERAS order, no drink COVID TEST- N/A   Anesthesia review: yes, seed implant scheduled for 11/29/21, follow up on requested records. Karoline Caldwell, PA saw patient in PAT d/t hx heart murmur.   Patient denies shortness of breath, fever, cough and chest pain at PAT appointment   All instructions explained to the patient, with a verbal understanding of the material. Patient agrees to go over the instructions while at home for a better understanding. Patient also instructed to self quarantine after being tested for COVID-19. The opportunity to ask questions was provided.

## 2021-11-23 NOTE — Progress Notes (Signed)
Anesthesia Chart Review:  Patient states she was evaluated by cardiologist Dr. Daneen Schick in 2010 for hypertension and heart murmur.  Echo 09/01/2008 showed normal LV size and function, mild TR. she states that Dr. Tamala Julian advised her no additional cardiology follow-up is necessary.  Hypertension has subsequently been followed by PCP Dr. Laurann Montana at [place].  No murmur appreciated on exam today.  She has good functional status, can climb 2 flights of stairs without cardiopulmonary symptoms.  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  Patient reports last dose 11/25/2021.  History of CKD 3 per PCP notes.  Preop labs reviewed, creatinine mildly elevated 1.39, otherwise unremarkable.  EKG done today shows NSR at rate of 62 bpm with LVH and diffuse T wave inversions.  Appears unchanged compared with tracing from 05/21/2010 from PCP office (copy on patient's chart).  TTE 09/01/2008 (copy on chart): Conclusions: 1.  Normal LV size and function. 2.  Trace mitral valve regurgitation. 3.  There is mild tricuspid valve regurgitation. 4.  Trace aortic valve regurgitation. 5.  Normal 2D, M-mode and color Doppler echocardiogram.   Wynonia Musty [facility] Short Stay Center/Anesthesiology Phone [phone] 11/23/2021 3:19 PM

## 2021-11-27 NOTE — H&P (Signed)
REFERRING PHYSICIAN:  Kelton Pillar, MD     PROVIDER:  Georgianne Fick, MD   Care Team: Patient Care Team: Maryla Morrow, MD as PCP - General (Family Medicine)    MRN: [id number] DOB: 29-Apr-1955    Subjective    Chief Complaint: Breast Problem       History of Present Illness: Amber Jackson is a 66 y.o. female who is seen today as an office consultation at the request of Dr. Laurann Montana for evaluation of Breast Problem   Patient is referred for right breast calcifications and discordant biopsy.  Patient had screening detected right breast calcifications.  Diagnostic imaging was performed which showed a 2.2 cm group of coarse calcifications in the outer central right breast.  A core needle biopsy was performed and this demonstrated atypical apocrine hyperplasia with associated calcifications.  There was a note that there was limited tissue and there were cellular findings that were concerning for possible DCIS.  A recommendation was made by radiology to excise this.   Patient does not have any personal cancer history or family cancer history.  She also has not had a history of breast problems.  Of note, she had skin blistering and breakdown from the Tegaderm post biopsy.   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   Diagnostic mammogram:[facility] 09/26/21 ACR Breast Density Category b: There are scattered areas of fibroglandular density.   FINDINGS: Loosely grouped coarse calcifications are confirmed within the outer central RIGHT breast, measuring 2.2 x 1.8 cm extent, most likely benign secretory calcifications, less likely casting calcifications. No pleomorphic or fine linear branching calcifications.   IMPRESSION: Indeterminate loosely grouped coarse calcifications within the outer central RIGHT breast, measuring up to 2.2 cm extent, most compatible with benign secretory calcifications, less likely  casting calcifications. Stereotactic biopsy is recommended to ensure benignity.   RECOMMENDATION: Stereotactic biopsy for the RIGHT breast calcifications.   Stereotactic biopsy is scheduled on July 3rd.   I have discussed the findings and recommendations with the patient. If applicable, a reminder letter will be sent to the patient regarding the next appointment.   BI-RADS CATEGORY  4: Suspicious.   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 NOTE: THERE IS LIMITED "LESIONAL" TISSUE PRESENT; HOWEVER, THE APOCRINE HYPERPLASIA HAS INCREASED NUCLEAR CYTOPLASMIC RATIO WITH HYPERCHROMASIA AND MITOSES CONCERNING FOR POSSIBLE DUCTAL CARCINOMA IN SITU; HOWEVER, THE LESIONAL MATERIAL IS LIMITED AND DOES NOT MEET SIZE CRITERIA FOR DEFINITIVE EVALUATION     Review of Systems: A complete review of systems was obtained from the patient.  I have reviewed this information and discussed as appropriate with the patient.  See HPI as well for other ROS. ROS + for heartburn and easy bruising.       Medical History: Past Medical History      Past Medical History:  Diagnosis Date   GERD (gastroesophageal reflux disease)     Hypertension             Patient Active Problem List  Diagnosis   Breast calcification, right      Past Surgical History       Past Surgical History:  Procedure Laterality Date   APPENDECTOMY       COLONOSCOPY            Allergies       Allergies  Allergen Reactions   Atorvastatin Other (See  Comments)      myalgia   Simvastatin Other (See Comments)      myalgia   Sulfa (Sulfonamide Antibiotics) Other (See Comments)              Current Outpatient Medications on File Prior to Visit  Medication Sig Dispense Refill   atenoloL (TENORMIN) 25 MG tablet Take 1 tablet by mouth once daily       cetirizine (ZYRTEC) 10 MG tablet Take 1 tablet by mouth once daily       cyclobenzaprine (FLEXERIL) 10 MG  tablet TAKE 1 TABLET BY MOUTH EVERY NIGHT AT BEDTIME AS NEEDED FOR HEADACHE OR PAIN       ezetimibe (ZETIA) 10 mg tablet 1 tablet       hydroCHLOROthiazide (MICROZIDE) 12.5 mg capsule Take 1 capsule by mouth in the morning Once a day 90 days       omeprazole (PRILOSEC) 40 MG DR capsule 1 capsule       warfarin (COUMADIN) 4 MG tablet TAKE 2 TABLETS BY MOUTH DAILY MONDAY- FRIDAY, AND 1 TABLET DAILY SATURDAY AND SUNDAY AS DIRECTED.       ascorbic acid, vitamin C, (VITAMIN C) 500 MG tablet Take 500 mg by mouth once daily       ascorbic acid/vitamin E/biotin (HAIR, SKIN, NAILS WITH BIOTIN ORAL) 3 tablets       B6-FA-B12-co Q10-herb no.225 100-800-200-100 mg-mcg-mcg-mg Tab 1 capsule with a meal       cholecalciferol (VITAMIN D3) 400 unit tablet Take 800 Units by mouth once daily       zinc citrate-phytase (ZYTAZE) 25-500 mg capsule Take by mouth        No current facility-administered medications on file prior to visit.      Family History       Family History  Problem Relation Age of Onset   High blood pressure (Hypertension) Mother     Hyperlipidemia (Elevated cholesterol) Mother     Coronary Artery Disease (Blocked arteries around heart) Mother     Deep vein thrombosis (DVT or abnormal blood clot formation) Mother          Social History       Tobacco Use  Smoking Status Never  Smokeless Tobacco Not on file      Social History  Social History        Socioeconomic History   Marital status: Married  Tobacco Use   Smoking status: Never  Substance and Sexual Activity   Alcohol use: Not Currently   Drug use: Never        Objective:         Vitals:      BP: (!) 144/86  Pulse: 77  Temp: 36.6 C (97.9 F)  SpO2: 95%  Weight: 80 kg (176 lb 6.4 oz)  Height: 160 cm ('5\' 3"'$ )    Body mass index is 31.25 kg/m.     Gen:  No acute distress.  Well nourished and well groomed.   Neurological: Alert and oriented to person, place, and time. Coordination normal.  Head:  Normocephalic and atraumatic.  Eyes: Conjunctivae are normal. Pupils are equal, round, and reactive to light. No scleral icterus.  Neck: Normal range of motion. Neck supple. No tracheal deviation or thyromegaly present.  Cardiovascular: Normal rate, regular rhythm, normal heart sounds and intact distal pulses.  Exam reveals no gallop and no friction rub.  No murmur heard. Breast: There are some skin changes on the upper breast consistent with healing following  blistering from the Tegaderm.  Breasts are ptotic bilaterally.  No palpable masses or skin dimpling.  There is no nipple retraction or nipple discharge.  No axillary lymphadenopathy. Respiratory: Effort normal.  No respiratory distress. No chest wall tenderness. Breath sounds normal.  No wheezes, rales or rhonchi.  GI: Soft. Bowel sounds are normal. The abdomen is soft and nontender.  There is no rebound and no guarding.  Musculoskeletal: Normal range of motion. Extremities are nontender.  Lymphadenopathy: No cervical, preauricular, postauricular or axillary adenopathy is present Skin: Skin is warm and dry. No rash noted. No diaphoresis. No erythema. No pallor. No clubbing, cyanosis, or edema.   Psychiatric: Normal mood and affect. Behavior is normal. Judgment and thought content normal.      Labs None available   Assessment and Plan:        ICD-10-CM    1. Breast calcification, right  R92.1         Patient has breast calcifications and a biopsy that is concerning for possible noninvasive cancer.  I recommend a seed localized lumpectomy.  I discussed the risk of finding noninvasive or invasive cancer and the subsequent actions that would occur.   The surgical procedure was described to the patient.  I discussed the incision type and location and that we would need radiology involved on with a seed marker.       The risks and benefits of the procedure were described to the patient and she wishes to proceed.     We discussed the risks  bleeding, infection, damage to other structures, need for further procedures/surgeries.  We discussed the risk of seroma.  The patient was advised if the area in the breast in cancer, we may need to go back to surgery for additional tissue to obtain negative margins or for a lymph node biopsy. The patient was advised that these are the most common complications, but that others can occur as well.      We will need to get clearance to hold her Coumadin.  This has been sent.  She request that the surgery be done on Thursday if possible.     No follow-ups on file.     Milus Height, MD FACS Surgical Oncology, General Surgery, Trauma and [facility] [place]

## 2021-11-29 ENCOUNTER — Ambulatory Visit
Admission: RE | Admit: 2021-11-29 | Discharge: 2021-11-29 | Disposition: A | Discharge status: Home / Self Care | Payer: No Typology Code available for payment source | Source: Ambulatory Visit | Attending: General Surgery | Admitting: General Surgery

## 2021-11-29 DIAGNOSIS — R921 Mammographic calcification found on diagnostic imaging of breast: Secondary | ICD-10-CM

## 2021-11-30 ENCOUNTER — Ambulatory Visit (HOSPITAL_BASED_OUTPATIENT_CLINIC_OR_DEPARTMENT_OTHER): Payer: No Typology Code available for payment source | Admitting: General Practice

## 2021-11-30 ENCOUNTER — Ambulatory Visit
Admission: RE | Admit: 2021-11-30 | Discharge: 2021-11-30 | Disposition: A | Discharge status: Home / Self Care | Payer: No Typology Code available for payment source | Source: Ambulatory Visit | Attending: General Surgery | Admitting: General Surgery

## 2021-11-30 ENCOUNTER — Other Ambulatory Visit: Payer: Self-pay

## 2021-11-30 ENCOUNTER — Ambulatory Visit (HOSPITAL_COMMUNITY): Payer: No Typology Code available for payment source | Admitting: Physician Assistant

## 2021-11-30 ENCOUNTER — Encounter (HOSPITAL_COMMUNITY)
Admission: RE | Disposition: A | Discharge status: Home / Self Care | Payer: Self-pay | Source: Home / Self Care | Attending: General Surgery

## 2021-11-30 ENCOUNTER — Other Ambulatory Visit (HOSPITAL_COMMUNITY): Payer: Self-pay

## 2021-11-30 ENCOUNTER — Encounter (HOSPITAL_COMMUNITY): Payer: Self-pay | Admitting: General Surgery

## 2021-11-30 ENCOUNTER — Ambulatory Visit (HOSPITAL_COMMUNITY)
Admission: RE | Admit: 2021-11-30 | Discharge: 2021-11-30 | Disposition: A | Discharge status: Home / Self Care | Payer: No Typology Code available for payment source | Attending: General Surgery | Admitting: General Surgery

## 2021-11-30 DIAGNOSIS — Z01818 Encounter for other preprocedural examination: Secondary | ICD-10-CM

## 2021-11-30 DIAGNOSIS — Z86711 Personal history of pulmonary embolism: Secondary | ICD-10-CM | POA: Diagnosis not present

## 2021-11-30 DIAGNOSIS — K219 Gastro-esophageal reflux disease without esophagitis: Secondary | ICD-10-CM | POA: Insufficient documentation

## 2021-11-30 DIAGNOSIS — R921 Mammographic calcification found on diagnostic imaging of breast: Secondary | ICD-10-CM

## 2021-11-30 DIAGNOSIS — D0511 Intraductal carcinoma in situ of right breast: Secondary | ICD-10-CM | POA: Diagnosis not present

## 2021-11-30 DIAGNOSIS — Z86718 Personal history of other venous thrombosis and embolism: Secondary | ICD-10-CM | POA: Insufficient documentation

## 2021-11-30 DIAGNOSIS — Z7901 Long term (current) use of anticoagulants: Secondary | ICD-10-CM | POA: Insufficient documentation

## 2021-11-30 DIAGNOSIS — I10 Essential (primary) hypertension: Secondary | ICD-10-CM | POA: Insufficient documentation

## 2021-11-30 DIAGNOSIS — N6489 Other specified disorders of breast: Secondary | ICD-10-CM

## 2021-11-30 HISTORY — PX: BREAST LUMPECTOMY WITH RADIOACTIVE SEED LOCALIZATION: SHX6424

## 2021-11-30 LAB — PROTIME-INR
INR: 1 (ref 0.8–1.2)
Prothrombin Time: 13.5 seconds (ref 11.4–15.2)

## 2021-11-30 LAB — APTT: aPTT: 26 seconds (ref 24–36)

## 2021-11-30 SURGERY — BREAST LUMPECTOMY WITH RADIOACTIVE SEED LOCALIZATION
Anesthesia: General | Site: Breast | Laterality: Right

## 2021-11-30 MED ORDER — LIDOCAINE 2% (20 MG/ML) 5 ML SYRINGE
INTRAMUSCULAR | Status: AC
  Filled 2021-11-30: qty 5

## 2021-11-30 MED ORDER — CHLORHEXIDINE GLUCONATE 0.12 % MT SOLN: 15.0000 mL | Freq: Once | OROMUCOSAL | Status: AC

## 2021-11-30 MED ORDER — CHLORHEXIDINE GLUCONATE CLOTH 2 % EX PADS: 6.0000 | MEDICATED_PAD | Freq: Once | CUTANEOUS | Status: DC

## 2021-11-30 MED ORDER — FENTANYL CITRATE (PF) 250 MCG/5ML IJ SOLN
INTRAMUSCULAR | Status: DC | PRN
  Administered 2021-11-30: 25 ug via INTRAVENOUS
  Administered 2021-11-30: 50 ug via INTRAVENOUS

## 2021-11-30 MED ORDER — KETOROLAC TROMETHAMINE 30 MG/ML IJ SOLN
INTRAMUSCULAR | Status: DC | PRN
  Administered 2021-11-30: 15 mg via INTRAVENOUS

## 2021-11-30 MED ORDER — LACTATED RINGERS IV SOLN: INTRAVENOUS | Status: DC

## 2021-11-30 MED ORDER — CEFAZOLIN SODIUM-DEXTROSE 2-4 GM/100ML-% IV SOLN
2.0000 g | INTRAVENOUS | Status: AC
  Administered 2021-11-30: 2 g via INTRAVENOUS

## 2021-11-30 MED ORDER — CHLORHEXIDINE GLUCONATE 0.12 % MT SOLN
OROMUCOSAL | Status: AC
  Administered 2021-11-30: 15 mL via OROMUCOSAL
  Filled 2021-11-30: qty 15

## 2021-11-30 MED ORDER — OXYCODONE HCL 5 MG/5ML PO SOLN: 5.0000 mg | Freq: Once | ORAL | Status: DC | PRN

## 2021-11-30 MED ORDER — PROPOFOL 10 MG/ML IV BOLUS
INTRAVENOUS | Status: AC
  Filled 2021-11-30: qty 20

## 2021-11-30 MED ORDER — OXYCODONE HCL 5 MG PO TABS: 5.0000 mg | ORAL_TABLET | Freq: Once | ORAL | Status: DC | PRN

## 2021-11-30 MED ORDER — PHENYLEPHRINE 80 MCG/ML (10ML) SYRINGE FOR IV PUSH (FOR BLOOD PRESSURE SUPPORT)
PREFILLED_SYRINGE | INTRAVENOUS | Status: DC | PRN
  Administered 2021-11-30 (×2): 80 ug via INTRAVENOUS
  Administered 2021-11-30: 160 ug via INTRAVENOUS

## 2021-11-30 MED ORDER — ACETAMINOPHEN 500 MG PO TABS
ORAL_TABLET | ORAL | Status: AC
  Administered 2021-11-30: 1000 mg via ORAL
  Filled 2021-11-30: qty 2

## 2021-11-30 MED ORDER — LIDOCAINE 2% (20 MG/ML) 5 ML SYRINGE
INTRAMUSCULAR | Status: DC | PRN
  Administered 2021-11-30: 70 mg via INTRAVENOUS

## 2021-11-30 MED ORDER — MIDAZOLAM HCL 2 MG/2ML IJ SOLN
INTRAMUSCULAR | Status: AC
  Filled 2021-11-30: qty 2

## 2021-11-30 MED ORDER — GLYCOPYRROLATE 0.2 MG/ML IJ SOLN
INTRAMUSCULAR | Status: DC | PRN
  Administered 2021-11-30: .2 mg via INTRAVENOUS

## 2021-11-30 MED ORDER — ONDANSETRON HCL 4 MG/2ML IJ SOLN: 4.0000 mg | Freq: Four times a day (QID) | INTRAMUSCULAR | Status: DC | PRN

## 2021-11-30 MED ORDER — ORAL CARE MOUTH RINSE: 15.0000 mL | Freq: Once | OROMUCOSAL | Status: AC

## 2021-11-30 MED ORDER — FENTANYL CITRATE (PF) 250 MCG/5ML IJ SOLN
INTRAMUSCULAR | Status: AC
  Filled 2021-11-30: qty 5

## 2021-11-30 MED ORDER — 0.9 % SODIUM CHLORIDE (POUR BTL) OPTIME
TOPICAL | Status: DC | PRN
  Administered 2021-11-30: 1000 mL

## 2021-11-30 MED ORDER — DEXAMETHASONE SODIUM PHOSPHATE 10 MG/ML IJ SOLN
INTRAMUSCULAR | Status: DC | PRN
  Administered 2021-11-30: 5 mg via INTRAVENOUS

## 2021-11-30 MED ORDER — BUPIVACAINE-EPINEPHRINE (PF) 0.25% -1:200000 IJ SOLN
INTRAMUSCULAR | Status: AC
  Filled 2021-11-30: qty 30

## 2021-11-30 MED ORDER — ACETAMINOPHEN 500 MG PO TABS: 1000.0000 mg | ORAL_TABLET | ORAL | Status: AC

## 2021-11-30 MED ORDER — ONDANSETRON HCL 4 MG/2ML IJ SOLN
INTRAMUSCULAR | Status: DC | PRN
  Administered 2021-11-30: 4 mg via INTRAVENOUS

## 2021-11-30 MED ORDER — DEXAMETHASONE SODIUM PHOSPHATE 10 MG/ML IJ SOLN
INTRAMUSCULAR | Status: AC
  Filled 2021-11-30: qty 1

## 2021-11-30 MED ORDER — FENTANYL CITRATE (PF) 100 MCG/2ML IJ SOLN: 25.0000 ug | INTRAMUSCULAR | Status: DC | PRN

## 2021-11-30 MED ORDER — ONDANSETRON HCL 4 MG/2ML IJ SOLN
INTRAMUSCULAR | Status: AC
  Filled 2021-11-30: qty 2

## 2021-11-30 MED ORDER — LIDOCAINE HCL (PF) 1 % IJ SOLN
INTRAMUSCULAR | Status: DC | PRN
  Administered 2021-11-30: 40 mL

## 2021-11-30 MED ORDER — MIDAZOLAM HCL 2 MG/2ML IJ SOLN
INTRAMUSCULAR | Status: DC | PRN
  Administered 2021-11-30 (×2): 1 mg via INTRAVENOUS

## 2021-11-30 MED ORDER — PROPOFOL 10 MG/ML IV BOLUS
INTRAVENOUS | Status: DC | PRN
  Administered 2021-11-30: 150 mg via INTRAVENOUS

## 2021-11-30 MED ORDER — CEFAZOLIN SODIUM-DEXTROSE 2-4 GM/100ML-% IV SOLN
INTRAVENOUS | Status: AC
  Filled 2021-11-30: qty 100

## 2021-11-30 MED ORDER — LIDOCAINE HCL (PF) 1 % IJ SOLN
INTRAMUSCULAR | Status: AC
  Filled 2021-11-30: qty 30

## 2021-11-30 MED ORDER — OXYCODONE HCL 5 MG PO TABS
5.0000 mg | ORAL_TABLET | Freq: Four times a day (QID) | ORAL | 0 refills | Status: DC | PRN
  Filled 2021-11-30: qty 5, 2d supply, fill #0

## 2021-11-30 SURGICAL SUPPLY — 42 items
BINDER BREAST LRG (GAUZE/BANDAGES/DRESSINGS) IMPLANT
BINDER BREAST XLRG (GAUZE/BANDAGES/DRESSINGS) IMPLANT
BLADE SURG 10 STRL SS (BLADE) ×1 IMPLANT
CANISTER SUCT 3000ML PPV (MISCELLANEOUS) IMPLANT
CHLORAPREP W/TINT 26 (MISCELLANEOUS) ×1 IMPLANT
CLIP VESOCCLUDE LG 6/CT (CLIP) ×1 IMPLANT
COVER PROBE W GEL 5X96 (DRAPES) ×1 IMPLANT
COVER SURGICAL LIGHT HANDLE (MISCELLANEOUS) ×1 IMPLANT
DERMABOND ADVANCED (GAUZE/BANDAGES/DRESSINGS) ×1
DERMABOND ADVANCED .7 DNX12 (GAUZE/BANDAGES/DRESSINGS) ×1 IMPLANT
DEVICE DUBIN SPECIMEN MAMMOGRA (MISCELLANEOUS) ×1 IMPLANT
DRAPE CHEST BREAST 15X10 FENES (DRAPES) ×1 IMPLANT
DRSG PAD ABDOMINAL 8X10 ST (GAUZE/BANDAGES/DRESSINGS) ×1 IMPLANT
ELECT BLADE 4.0 EZ CLEAN MEGAD (MISCELLANEOUS) ×1
ELECT COATED BLADE 2.86 ST (ELECTRODE) ×1 IMPLANT
ELECT REM PT RETURN 9FT ADLT (ELECTROSURGICAL) ×1
ELECTRODE BLDE 4.0 EZ CLN MEGD (MISCELLANEOUS) IMPLANT
ELECTRODE REM PT RTRN 9FT ADLT (ELECTROSURGICAL) ×1 IMPLANT
GAUZE SPONGE 4X4 12PLY STRL (GAUZE/BANDAGES/DRESSINGS) IMPLANT
GAUZE SPONGE 4X4 12PLY STRL LF (GAUZE/BANDAGES/DRESSINGS) ×1 IMPLANT
GLOVE BIO SURGEON STRL SZ 6 (GLOVE) ×1 IMPLANT
GLOVE INDICATOR 6.5 STRL GRN (GLOVE) ×1 IMPLANT
GOWN STRL REUS W/ TWL LRG LVL3 (GOWN DISPOSABLE) ×1 IMPLANT
GOWN STRL REUS W/TWL 2XL LVL3 (GOWN DISPOSABLE) ×1 IMPLANT
GOWN STRL REUS W/TWL LRG LVL3 (GOWN DISPOSABLE) ×1
KIT BASIN OR (CUSTOM PROCEDURE TRAY) ×1 IMPLANT
KIT MARKER MARGIN INK (KITS) ×1 IMPLANT
LIGHT WAVEGUIDE WIDE FLAT (MISCELLANEOUS) IMPLANT
NDL HYPO 25GX1X1/2 BEV (NEEDLE) ×1 IMPLANT
NEEDLE HYPO 25GX1X1/2 BEV (NEEDLE) ×1 IMPLANT
NS IRRIG 1000ML POUR BTL (IV SOLUTION) IMPLANT
PACK GENERAL/GYN (CUSTOM PROCEDURE TRAY) ×1 IMPLANT
STRIP CLOSURE SKIN 1/2X4 (GAUZE/BANDAGES/DRESSINGS) ×1 IMPLANT
SUT MNCRL AB 4-0 PS2 18 (SUTURE) ×1 IMPLANT
SUT SILK 2 0 SH (SUTURE) IMPLANT
SUT VIC AB 2-0 SH 27 (SUTURE) ×1
SUT VIC AB 2-0 SH 27XBRD (SUTURE) ×1 IMPLANT
SUT VIC AB 3-0 SH 27 (SUTURE) ×1
SUT VIC AB 3-0 SH 27X BRD (SUTURE) ×1 IMPLANT
SYR CONTROL 10ML LL (SYRINGE) ×1 IMPLANT
TOWEL GREEN STERILE (TOWEL DISPOSABLE) ×1 IMPLANT
TOWEL GREEN STERILE FF (TOWEL DISPOSABLE) ×1 IMPLANT

## 2021-11-30 NOTE — Transfer of Care (Signed)
Immediate Anesthesia Transfer of Care Note  Patient: Amber Jackson  Procedure(s) Performed: RIGHT BREAST RADIOACTIVE SEED LOCALIZED LUMPECTOMY (Right: Breast)  Patient Location: PACU  Anesthesia Type:General  Level of Consciousness: awake and drowsy  Airway & Oxygen Therapy: Patient Spontanous Breathing  Post-op Assessment: Report given to RN and Post -op Vital signs reviewed and stable  Post vital signs: Reviewed and stable  Last Vitals:  Vitals Value Taken Time  BP 151/74 11/30/21 0901  Temp    Pulse 75 11/30/21 0902  Resp 20 11/30/21 0902  SpO2 96 % 11/30/21 0902  Vitals shown include unvalidated device data.  Last Pain:  Vitals:   11/30/21 0612  TempSrc:   PainSc: 0-No pain         Complications: No notable events documented.

## 2021-11-30 NOTE — Discharge Instructions (Addendum)
[facility] Office Phone Number [phone]  BREAST BIOPSY/ PARTIAL MASTECTOMY: POST OP INSTRUCTIONS  Always review your discharge instruction sheet given to you by the facility where your surgery was performed.  IF YOU HAVE DISABILITY OR FAMILY LEAVE FORMS, YOU MUST BRING THEM TO THE OFFICE FOR PROCESSING.  DO NOT GIVE THEM TO YOUR DOCTOR.  Take 2 tylenol (acetominophen) three times a day for 3 days.  If you still have pain, add ibuprofen with food in between if able to take this (if you have kidney issues or stomach issues, do not take ibuprofen).  If both of those are not enough, add the narcotic pain pill.  If you find you are needing a lot of this overnight after surgery, call the next morning for a refill.    Prescriptions will not be filled after 5pm or on week-ends. Take your usually prescribed medications unless otherwise directed You should eat very light the first 24 hours after surgery, such as soup, crackers, pudding, etc.  Resume your normal diet the day after surgery. Most patients will experience some swelling and bruising in the breast.  Ice packs and a good support bra will help.  Swelling and bruising can take several days to resolve.  It is common to experience some constipation if taking pain medication after surgery.  Increasing fluid intake and taking a stool softener will usually help or prevent this problem from occurring.  A mild laxative (Milk of Magnesia or Miralax) should be taken according to package directions if there are no bowel movements after 48 hours. Unless discharge instructions indicate otherwise, you may remove your bandages 48 hours after surgery, and you may shower at that time.  You may have steri-strips (small skin tapes) in place directly over the incision.  These strips should be left on the skin at least for for 7-10 days.    ACTIVITIES:  You may resume regular daily activities (gradually increasing) beginning the next day.  Wearing a  good support bra or sports bra (or the breast binder) minimizes pain and swelling.  You may have sexual intercourse when it is comfortable. No heavy lifting for 1-2 weeks (not over around 10 pounds).  You may drive when you no longer are taking prescription pain medication, you can comfortably wear a seatbelt, and you can safely maneuver your car and apply brakes. RETURN TO WORK:  __________3-14 days depending on job. _______________ You should see your doctor in the office for a follow-up appointment approximately two weeks after your surgery.  Your doctor's nurse will typically make your follow-up appointment when she calls you with your pathology report.  Expect your pathology report 3-4 business days after your surgery.  You may call to check if you do not hear from us after three days.   WHEN TO CALL YOUR DOCTOR: Fever over 101.0 Nausea and/or vomiting. Extreme swelling or bruising. Continued bleeding from incision. Increased pain, redness, or drainage from the incision.  The clinic staff is available to answer your questions during regular business hours.  Please don't hesitate to call and ask to speak to one of the nurses for clinical concerns.  If you have a medical emergency, go to the nearest emergency room or call 911.  A surgeon from [facility] is always on call at the hospital.  For further questions, please visit [email or web address]   

## 2021-11-30 NOTE — Anesthesia Postprocedure Evaluation (Signed)
Anesthesia Post Note  Patient: Amber Jackson  Procedure(s) Performed: RIGHT BREAST RADIOACTIVE SEED LOCALIZED LUMPECTOMY (Right: Breast)     Patient location during evaluation: PACU Anesthesia Type: General Level of consciousness: awake and alert Pain management: pain level controlled Vital Signs Assessment: post-procedure vital signs reviewed and stable Respiratory status: spontaneous breathing, nonlabored ventilation, respiratory function stable and patient connected to nasal cannula oxygen Cardiovascular status: blood pressure returned to baseline and stable Postop Assessment: no apparent nausea or vomiting Anesthetic complications: no   No notable events documented.  Last Vitals:  Vitals:   11/30/21 0915 11/30/21 0930  BP: (!) 156/83 (!) 156/86  Pulse: 68 70  Resp: 20 (!) 21  Temp:  36.4 C  SpO2: 96% 95%    Last Pain:  Vitals:   11/30/21 0930  TempSrc:   PainSc: 0-No pain                 Marlee Trentman S

## 2021-11-30 NOTE — Op Note (Signed)
Right Breast Radioactive seed localized lumpectomy  Indications: This patient presents with history of right breast calcifications, atypical apocrine hyperplasia, highly suspicious for DCIS  Pre-operative Diagnosis: right breast calcifications  Post-operative Diagnosis: Same  Surgeon: Stark Klein   Anesthesia: General endotracheal anesthesia  ASA Class: 2  Procedure Details  The patient was seen in the Holding Room. The risks, benefits, complications, treatment options, and expected outcomes were discussed with the patient. The possibilities of bleeding, infection, the need for additional procedures, failure to diagnose a condition, and creating a complication requiring other procedures or operations were discussed with the patient. The patient concurred with the proposed plan, giving informed consent.  The site of surgery properly noted/marked. The patient was taken to Operating Room # 2, identified, and the procedure verified as right breast seed localized lumpectomy.  The right breast and chest were prepped and draped in standard fashion. A lateral circumareolar incision was made near the previously placed radioactive seed.  Dissection was carried down around the point of maximum signal intensity. The cautery was used to perform the dissection.   The specimen was inked with the margin marker paint kit.    Specimen radiography confirmed inclusion of the mammographic lesion and the seed.  The background signal in the breast was zero.  Hemostasis was achieved with cautery.  The cavity was marked with clips on each border other than the anterior border.  The wound was irrigated and closed with 3-0 vicryl interrupted deep dermal sutures and 4-0 monocryl running subcuticular suture.      Sterile dressings were applied. At the end of the operation, all sponge, instrument, and needle counts were correct.   Findings: Seed, calcifications in specimen.  anterior margin is skin   Estimated Blood  Loss:  min         Specimens: left breast tissue with seed         Complications:  None; patient tolerated the procedure well.         Disposition: PACU - hemodynamically stable.         Condition: stable

## 2021-11-30 NOTE — Anesthesia Procedure Notes (Signed)
Procedure Name: LMA Insertion Date/Time: 11/30/2021 7:58 AM  Performed by: Dorann Lodge, CRNAPre-anesthesia Checklist: Patient identified, Emergency Drugs available, Suction available and Patient being monitored Patient Re-evaluated:Patient Re-evaluated prior to induction Oxygen Delivery Method: Circle System Utilized Preoxygenation: Pre-oxygenation with 100% oxygen Induction Type: IV induction Ventilation: Mask ventilation without difficulty LMA: LMA inserted LMA Size: 4.0 Number of attempts: 1 Airway Equipment and Method: Bite block Placement Confirmation: positive ETCO2 Tube secured with: Tape Dental Injury: Teeth and Oropharynx as per pre-operative assessment

## 2021-11-30 NOTE — Interval H&P Note (Signed)
History and Physical Interval Note:  11/30/2021 7:41 AM  Amber Jackson  has presented today for surgery, with the diagnosis of RIGHT BREAST CALCIFICATIONS.  The various methods of treatment have been discussed with the patient and family. After consideration of risks, benefits and other options for treatment, the patient has consented to  Procedure(s): RIGHT BREAST RADIOACTIVE SEED LOCALIZED LUMPECTOMY (Right) as a surgical intervention.  The patient's history has been reviewed, patient examined, no change in status, stable for surgery.  I have reviewed the patient's chart and labs.  Questions were answered to the patient's satisfaction.     Stark Klein

## 2021-12-01 ENCOUNTER — Encounter (HOSPITAL_COMMUNITY): Payer: Self-pay | Admitting: General Surgery

## 2021-12-06 ENCOUNTER — Encounter (HOSPITAL_COMMUNITY): Payer: Self-pay

## 2021-12-06 LAB — SURGICAL PATHOLOGY

## 2021-12-14 ENCOUNTER — Other Ambulatory Visit (HOSPITAL_COMMUNITY): Payer: Self-pay

## 2021-12-14 MED ORDER — ATENOLOL 25 MG PO TABS
25.0000 mg | ORAL_TABLET | Freq: Every day | ORAL | 3 refills | Status: AC
  Filled 2021-12-14: qty 90, 90d supply, fill #0
  Filled 2022-03-07: qty 90, 90d supply, fill #1
  Filled 2022-05-30: qty 90, 90d supply, fill #2

## 2021-12-18 DIAGNOSIS — D0511 Intraductal carcinoma in situ of right breast: Secondary | ICD-10-CM | POA: Insufficient documentation

## 2021-12-18 NOTE — Progress Notes (Signed)
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   HISTORY OF PRESENT ILLNESS: Amber Jackson is a 66 y.o. female seen at the request of Dr. Barry Dienes for a diagnosis of right breast cancer..  The patient originally was found to have screening detected density and calcifications in the right breast.  She underwent further work-up with diagnostic right mammogram showing a loosely grouped area of coarse calcifications extending up to 2.2 cm in greatest dimension.  She underwent stereotactic biopsy on 10/02/2021 which showed atypical apocrine hyperplasia with calcifications.  She was counseled on excisional procedure with lumpectomy which she underwent on 11/30/2021.  Final pathology revealed high-grade DCIS with apocrine features and central necrosis.  Her medial margin was less than 1 mm but the remainder of her margins were clear.  ER and PR were positive with strong staining she is seen to discuss adjuvant radiotherapy.    PREVIOUS RADIATION THERAPY: {EXAM; YES/NO:19492::"No"}   PAST MEDICAL HISTORY:  Past Medical History:  Diagnosis Date   DVT (deep venous thrombosis) (HCC)    right leg per patient   GERD (gastroesophageal reflux disease)    Heart murmur    pt had ECHO in 2010, no recent issues with this   Hypercholesterolemia    Hypertension    PONV (postoperative nausea and vomiting)    after appendectomy       PAST SURGICAL HISTORY: Past Surgical History:  Procedure Laterality Date   APPENDECTOMY     "years ago"   BREAST LUMPECTOMY WITH RADIOACTIVE SEED LOCALIZATION Right 11/30/2021   Procedure: RIGHT BREAST RADIOACTIVE SEED LOCALIZED LUMPECTOMY;  Surgeon: Stark Klein, MD;  Location: [facility];  Service: General;  Laterality: Right;   COLONOSCOPY       FAMILY HISTORY: No family history on file.   SOCIAL HISTORY:  reports that she has never smoked. She has never used smokeless tobacco. She reports that she does not drink alcohol and does not use drugs.  The patient is married and lives in [place].  She works at [employer]***   ALLERGIES: Atorvastatin, Simvastatin, and Sulfa antibiotics   MEDICATIONS:  Current Outpatient Medications  Medication Sig Dispense Refill   Ascorbic Acid (VITAMIN C) 100 MG CHEW Chew 100 mg by mouth daily.     atenolol (TENORMIN) 25 MG tablet Take 1 tablet (25 mg total) by mouth daily. 90 tablet 3   cetirizine (ZYRTEC) 10 MG tablet TAKE 1 TABLET BY MOUTH ONCE A DAY 90 90 tablet 3   Cholecalciferol (VITAMIN D) 50 MCG (2000 UT) tablet Take 2,000 Units by mouth daily.     Coenzyme Q10 (CO Q-10) 100 MG CAPS Take 100 mg by mouth daily.     cyclobenzaprine (FLEXERIL) 10 MG tablet Take 1 tablet (10 mg total) by mouth at bedtime as needed once a day for headache or spasm 90 tablet 1   cyclobenzaprine (FLEXERIL) 10 MG tablet Take 1 tablet (10 mg total) by mouth at bedtime as needed for headache or pain 90 tablet 0   DEXILANT 60 MG capsule TAKE 1 CAPSULE BY MOUTH ONCE A DAY 30 (Patient not taking: Reported on 11/21/2021) 30 capsule 6  DEXILANT 60 MG capsule TAKE 1 CAPSULE BY MOUTH ONCE A DAY (Patient not taking: Reported on 11/21/2021) 90 capsule 1   Elderberry 500 MG CAPS Take 500 mg by mouth daily.     enoxaparin (LOVENOX) 40 MG/0.4ML injection Inject 0.4 mL into the skin once a day 7 days 2.8 mL 0   ezetimibe (ZETIA) 10 MG tablet Take 1 tablet by mouth once daily 90 tablet 1   ezetimibe (ZETIA) 10 MG tablet Take 1 tablet by mouth once a day 90 days 90 tablet 1   ezetimibe (ZETIA) 10 MG tablet Take 1 tablet (10 mg total) by mouth daily. 90 tablet 2   hydrochlorothiazide (MICROZIDE) 12.5 MG capsule TAKE 1 CAPSULE BY MOUTH  ONCE A DAY 90 capsule 2   hydrochlorothiazide (MICROZIDE) 12.5 MG capsule Take 1 capsule by mouth in the morning Once a day 90 capsule 1   hydrochlorothiazide (MICROZIDE) 12.5 MG capsule 1 capsule in the morning Orally Once a day 90 days 90 capsule 2   Multiple Vitamins-Minerals (HAIR/SKIN/NAILS) TABS Take 3 tablets by mouth daily.     Omega-3 Fatty Acids (FISH OIL) 1000 MG CAPS Take 2,000 mg by mouth daily.     omeprazole (PRILOSEC) 40 MG capsule Take 1 capsule by mouth twice daily. 60 capsule 3   omeprazole (PRILOSEC) 40 MG capsule Take 1 capsule (40 mg total) by mouth 2 (two) times daily. 60 capsule 3   oxyCODONE (OXY IR/ROXICODONE) 5 MG immediate release tablet Take 1 tablet (5 mg total) by mouth every 6 (six) hours as needed for severe pain. 5 tablet 0   Red Yeast Rice 600 MG CAPS Take 1,200 mg by mouth daily.     warfarin (COUMADIN) 4 MG tablet TAKE 2 TABLETS BY MOUTH DAILY MONDAY- FRIDAY, AND 1 TABLET DAILY SATURDAY AND SUNDAY AS DIRECTED. (Patient not taking: Reported on 11/21/2021) 145 tablet 11   warfarin (COUMADIN) 4 MG tablet TAKE 2 TABLETS BY MOUTH DAILY MONDAY- FRIDAY, AND 1 TABLET DAILY SATURDAY AND SUNDAY AS DIRECTED. 145 tablet 5   Zinc 100 MG TABS Take 100 mg by mouth daily.     No current facility-administered medications for this visit.     REVIEW OF SYSTEMS: On review of systems, the patient reports that she is doing ***     PHYSICAL EXAM:  Wt Readings from Last 3 Encounters:  11/30/21 175 lb (79.4 kg)  11/23/21 175 lb 9.6 oz (79.7 kg)   Temp Readings from Last 3 Encounters:  11/30/21 97.6 F (36.4 C)  11/23/21 98.2 F (36.8 C)   BP Readings from Last 3 Encounters:  11/30/21 (!) 156/86  11/23/21 (!) 150/82   Pulse Readings from Last 3 Encounters:  11/30/21 70  11/23/21 75    In general this is a well appearing *** female in no acute distress. She's alert and oriented x4 and appropriate throughout the examination. Cardiopulmonary assessment is negative for  acute distress and she exhibits normal effort. Bilateral breast exam is deferred.    ECOG = ***  0 - Asymptomatic (Fully active, able to carry on all predisease activities without restriction)  1 - Symptomatic but completely ambulatory (Restricted in physically strenuous activity but ambulatory and able to carry out work of a light or sedentary nature. For example, light housework, office work)  2 - Symptomatic, <50% in bed during the day (Ambulatory and capable of all self care but unable to carry out any work activities. Up and about more than 50% of waking hours)  3 - Symptomatic, >50% in bed, but not bedbound (Capable of only limited self-care, confined to bed or chair 50% or more of waking hours)  4 - Bedbound (Completely disabled. Cannot carry on any self-care. Totally confined to bed or chair)  5 - Death   Eustace Pen MM, Creech RH, Tormey DC, et al. [phone]). "Toxicity and response criteria of the [facility]". [place] Oncol. 5 (6): 649-55    LABORATORY DATA:  Lab Results  Component Value Date   WBC 7.9 11/23/2021   HGB 13.1 11/23/2021   HCT 40.3 11/23/2021   MCV 93.9 11/23/2021   PLT 286 11/23/2021   Lab Results  Component Value Date   NA 139 11/23/2021   K 3.6 11/23/2021   CL 104 11/23/2021   CO2 29 11/23/2021   No results found for: "ALT", "AST", "GGT", "ALKPHOS", "BILITOT"    RADIOGRAPHY: MM Breast Surgical Specimen  Result Date: 11/30/2021 CLINICAL DATA:  Status post right breast excision. EXAM: SPECIMEN RADIOGRAPH OF THE RIGHT BREAST COMPARISON:  Previous exam(s). FINDINGS: Status post excision of the right breast. The radioactive seed is present, completely intact, and were marked for pathology. The marking clip is not included in the specimen as it was inferior to the localized site due to clip migration. IMPRESSION: Specimen radiograph of the right breast. Electronically Signed   By: Lovey Newcomer M.D.   On: 11/30/2021 08:47   MM RT  RADIOACTIVE SEED LOC MAMMO GUIDE  Result Date: 11/29/2021 CLINICAL DATA:  Localization prior to surgery. EXAM: MAMMOGRAPHIC GUIDED RADIOACTIVE SEED LOCALIZATION OF THE RIGHT BREAST COMPARISON:  Previous exam(s). FINDINGS: Patient presents for radioactive seed localization prior to surgery. I met with the patient and we discussed the procedure of seed localization including benefits and alternatives. We discussed the high likelihood of a successful procedure. We discussed the risks of the procedure including infection, bleeding, tissue injury and further surgery. We discussed the low dose of radioactivity involved in the procedure. Informed, written consent was given. The usual time-out protocol was performed immediately prior to the procedure. Using mammographic guidance, sterile technique, 1% lidocaine and an I-125 radioactive seed, the residual calcifications (the X shaped clip was displaced approximately 1.6 cm from the residual calcifications) was localized using a lateral to medial approach. The follow-up mammogram images confirm the seed in the expected location and were marked for Dr. Barry Dienes. Follow-up survey of the patient confirms presence of the radioactive seed. Order number of I-125 seed:  [id number]. Total activity:  0.174 millicuries reference Date: 11/01/2021 The patient tolerated the procedure well and was released from the [facility]. She was given instructions regarding seed removal. IMPRESSION: Radioactive seed localization right breast. No apparent complications. Electronically Signed   By: Lillia Mountain M.D.   On: 11/29/2021 14:11      IMPRESSION/PLAN: 1. High-grade ER/PR positive DCIS of the right breast. Dr. Lisbeth Renshaw discusses the pathology findings and reviews the nature of early stage breast disease. She has done well since surgery and Dr. Lisbeth Renshaw recommends a course of adjuvant external radiotherapy to the breast  to reduce risks of local recurrence followed by antiestrogen therapy. We  discussed the risks, benefits, short, and long term effects of radiotherapy, as well as the curative intent, and the patient is interested in proceeding. Dr. Lisbeth Renshaw discusses the delivery and logistics of radiotherapy and recommends 4 weeks of radiotherapy. Written consent is obtained and placed in the chart, a copy was provided to the patient. The patient will be contacted  to coordinate treatment planning by our simulation department.  2. Possible genetic predisposition to malignancy. The patient is a candidate for genetic testing given her personal and family history. She will meet with our geneticist today in clinic.***   In a visit lasting *** minutes, greater than 50% of the time was spent face to face reviewing her case, as well as in preparation of, discussing, and coordinating the patient's care.  The above documentation reflects my direct findings during this shared patient visit. Please see the separate note by Dr. Lisbeth Renshaw on this date for the remainder of the patient's plan of care.    Carola Rhine, [facility]    **Disclaimer: This note was dictated with voice recognition software. Similar sounding words can inadvertently be transcribed and this note may contain transcription errors which may not have been corrected upon publication of note.**

## 2021-12-18 NOTE — Progress Notes (Signed)
New Breast Cancer Diagnosis: Right Breast Outer Central Portion  Did patient present with symptoms (if so, please note symptoms) or screening mammography?:Screening Calcifications    Location and Extent of disease :right breast.  A 2.2 cm group of coarse calcifications in the outer central right breast.  Adenopathy no.  Histology per Pathology Report: grade 3, DCIS 11/30/2021  Receptor Status: ER(positive), PR (positive), Her2-neu (), Ki-(%)  Surgeon and surgical plan, if any:  Dr. Barry Dienes -Right Breast radioactive seed localized Lumpectomy 11/30/2021 -Follow-up 12/18/2021   Medical oncologist, treatment if any:   Appointment unscheduled at this time  Family History of Breast/Ovarian/Prostate Cancer: Sister had Ovarian Cancer.  Lymphedema issues, if any: No     Pain issues, if any: No    SAFETY ISSUES: Prior radiation? No Pacemaker/ICD? No Possible current pregnancy? Postmenopausal Is the patient on methotrexate? No  Current Complaints / other details:   On Coumadin for protein C resistance and history or PE.

## 2021-12-19 ENCOUNTER — Ambulatory Visit
Admission: RE | Admit: 2021-12-19 | Discharge: 2021-12-19 | Disposition: A | Discharge status: Home / Self Care | Payer: No Typology Code available for payment source | Source: Ambulatory Visit | Attending: Radiation Oncology | Admitting: Radiation Oncology

## 2021-12-19 ENCOUNTER — Other Ambulatory Visit: Payer: Self-pay

## 2021-12-19 ENCOUNTER — Telehealth: Payer: Self-pay | Admitting: Hematology and Oncology

## 2021-12-19 ENCOUNTER — Encounter: Payer: Self-pay | Admitting: Radiation Oncology

## 2021-12-19 VITALS — BP 168/71 | HR 65 | Temp 97.8°F | Resp 16 | Wt 171.8 lb

## 2021-12-19 DIAGNOSIS — D0511 Intraductal carcinoma in situ of right breast: Secondary | ICD-10-CM | POA: Diagnosis present

## 2021-12-19 DIAGNOSIS — Z170 Estrogen receptor positive status [ER+]: Secondary | ICD-10-CM | POA: Diagnosis not present

## 2021-12-19 DIAGNOSIS — E7800 Pure hypercholesterolemia, unspecified: Secondary | ICD-10-CM | POA: Diagnosis not present

## 2021-12-19 DIAGNOSIS — Z7901 Long term (current) use of anticoagulants: Secondary | ICD-10-CM | POA: Diagnosis not present

## 2021-12-19 DIAGNOSIS — Z79899 Other long term (current) drug therapy: Secondary | ICD-10-CM | POA: Diagnosis not present

## 2021-12-19 DIAGNOSIS — Z86718 Personal history of other venous thrombosis and embolism: Secondary | ICD-10-CM | POA: Diagnosis not present

## 2021-12-19 DIAGNOSIS — I10 Essential (primary) hypertension: Secondary | ICD-10-CM | POA: Diagnosis not present

## 2021-12-19 NOTE — Telephone Encounter (Signed)
Scheduled appt per 9/19 referral. Pt is aware of appt date and time. Pt is aware to arrive 15 mins prior to appt time and to bring and updated insurance card. Pt is aware of appt location.   

## 2021-12-25 ENCOUNTER — Other Ambulatory Visit (HOSPITAL_COMMUNITY): Payer: Self-pay

## 2021-12-26 ENCOUNTER — Inpatient Hospital Stay
Payer: No Typology Code available for payment source | Attending: Hematology and Oncology | Admitting: Hematology and Oncology

## 2021-12-26 ENCOUNTER — Encounter: Payer: Self-pay | Admitting: Hematology and Oncology

## 2021-12-26 ENCOUNTER — Inpatient Hospital Stay: Payer: No Typology Code available for payment source

## 2021-12-26 ENCOUNTER — Other Ambulatory Visit: Payer: Self-pay

## 2021-12-26 DIAGNOSIS — I10 Essential (primary) hypertension: Secondary | ICD-10-CM | POA: Insufficient documentation

## 2021-12-26 DIAGNOSIS — Z7901 Long term (current) use of anticoagulants: Secondary | ICD-10-CM | POA: Diagnosis not present

## 2021-12-26 DIAGNOSIS — Z79811 Long term (current) use of aromatase inhibitors: Secondary | ICD-10-CM | POA: Diagnosis not present

## 2021-12-26 DIAGNOSIS — D0511 Intraductal carcinoma in situ of right breast: Secondary | ICD-10-CM | POA: Diagnosis present

## 2021-12-26 DIAGNOSIS — Z8041 Family history of malignant neoplasm of ovary: Secondary | ICD-10-CM | POA: Insufficient documentation

## 2021-12-26 DIAGNOSIS — Z86718 Personal history of other venous thrombosis and embolism: Secondary | ICD-10-CM | POA: Insufficient documentation

## 2021-12-26 NOTE — Progress Notes (Signed)
[facility] NOTE  Patient Care Team: Kelton Pillar, MD as PCP - General (Family Medicine)  CHIEF COMPLAINTS/PURPOSE OF CONSULTATION:  Newly diagnosed breast cancer  HISTORY OF PRESENTING ILLNESS:  Amber Jackson 66 y.o. female is here because of recent diagnosis of right breast DCIS  I reviewed her records extensively and collaborated the history with the patient.  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   Diagnostic mammogram showed indeterminate loosely grouped calcifications measuring up to 2.2 cm in extent.  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 Blood clots run in the family, mom and son had it. Daughter is 41, son is 71.   She is s/p lumpectomy, healing well.  She is anticipating radiation simulation soon. She denies any HRT, took birth control for a long time. She works for [employer], has been working there for almost 19 years.  No family history of breast cancer, ovarian cancer or colon cancer.  She is up-to-date with age-appropriate cancer screening.  Rest of the pertinent 10 point ROS reviewed and negative  MEDICAL HISTORY:  Past Medical History:  Diagnosis Date   Breast cancer ([place]) 2023   DVT (deep venous thrombosis) (HCC)    right leg per patient   GERD (gastroesophageal reflux disease)    Heart murmur    pt had ECHO in 2010, no recent issues with this   Hypercholesterolemia     Hypertension    PONV (postoperative nausea and vomiting)    after appendectomy    SURGICAL HISTORY: Past Surgical History:  Procedure Laterality Date   APPENDECTOMY     "years ago"   BREAST LUMPECTOMY WITH RADIOACTIVE SEED LOCALIZATION Right 11/30/2021   Procedure: RIGHT BREAST RADIOACTIVE SEED LOCALIZED LUMPECTOMY;  Surgeon: Stark Klein, MD;  Location: [facility];  Service: General;  Laterality: Right;   COLONOSCOPY      SOCIAL HISTORY: Social History   Socioeconomic History   Marital status: Married    Spouse name: Not on file   Number of children: Not on file   Years of education: Not on file   Highest education level: Not on file  Occupational History   Not on file  Tobacco Use   Smoking status: Never   Smokeless tobacco: Never  Vaping Use   Vaping Use: Never used  Substance and Sexual Activity   Alcohol use: Never   Drug use: Never   Sexual activity: Not on file  Other Topics Concern   Not on file  Social History Narrative   Not on file   Social Determinants of Health   Financial Resource Strain: Not on file  Food Insecurity: Not on file  Transportation Needs: Not on file  Physical Activity: Not on file  Stress: Not on file  Social Connections: Not on file  Intimate Partner Violence: Not At Risk (12/19/2021)   Humiliation, Afraid, Rape, and Kick questionnaire    Fear of Current or Ex-Partner: No    Emotionally Abused: No    Physically Abused:  No    Sexually Abused: No    FAMILY HISTORY: Family History  Problem Relation Age of Onset   Ovarian cancer Sister     ALLERGIES:  is allergic to atorvastatin, simvastatin, and sulfa antibiotics.  MEDICATIONS:  Current Outpatient Medications  Medication Sig Dispense Refill   Ascorbic Acid (VITAMIN C) 100 MG CHEW Chew 100 mg by mouth daily.     atenolol (TENORMIN) 25 MG tablet Take 1 tablet (25 mg total) by mouth daily. 90 tablet 3   cetirizine (ZYRTEC) 10 MG tablet TAKE 1 TABLET BY MOUTH ONCE A DAY 90 90  tablet 3   Cholecalciferol (VITAMIN D) 50 MCG (2000 UT) tablet Take 2,000 Units by mouth daily.     Coenzyme Q10 (CO Q-10) 100 MG CAPS Take 100 mg by mouth daily.     cyclobenzaprine (FLEXERIL) 10 MG tablet Take 1 tablet (10 mg total) by mouth at bedtime as needed once a day for headache or spasm 90 tablet 1   Elderberry 500 MG CAPS Take 500 mg by mouth daily.     ezetimibe (ZETIA) 10 MG tablet Take 1 tablet by mouth once daily 90 tablet 1   ezetimibe (ZETIA) 10 MG tablet Take 1 tablet (10 mg total) by mouth daily. 90 tablet 2   hydrochlorothiazide (MICROZIDE) 12.5 MG capsule TAKE 1 CAPSULE BY MOUTH ONCE A DAY 90 capsule 2   hydrochlorothiazide (MICROZIDE) 12.5 MG capsule Take 1 capsule by mouth in the morning Once a day 90 capsule 1   Multiple Vitamins-Minerals (HAIR/SKIN/NAILS) TABS Take 3 tablets by mouth daily.     Omega-3 Fatty Acids (FISH OIL) 1000 MG CAPS Take 2,000 mg by mouth daily.     omeprazole (PRILOSEC) 40 MG capsule Take 1 capsule by mouth twice daily. 60 capsule 3   Red Yeast Rice 600 MG CAPS Take 1,200 mg by mouth daily.     warfarin (COUMADIN) 4 MG tablet TAKE 2 TABLETS BY MOUTH DAILY MONDAY- FRIDAY, AND 1 TABLET DAILY SATURDAY AND SUNDAY AS DIRECTED. 145 tablet 5   Zinc 100 MG TABS Take 100 mg by mouth daily.     No current facility-administered medications for this visit.    REVIEW OF SYSTEMS:   Constitutional: Denies fevers, chills or abnormal night sweats Eyes: Denies blurriness of vision, double vision or watery eyes Ears, nose, mouth, throat, and face: Denies mucositis or sore throat Respiratory: Denies cough, dyspnea or wheezes Cardiovascular: Denies palpitation, chest discomfort or lower extremity swelling Gastrointestinal:  Denies nausea, heartburn or change in bowel habits Skin: Denies abnormal skin rashes Lymphatics: Denies new lymphadenopathy or easy bruising Neurological:Denies numbness, tingling or new weaknesses Behavioral/Psych: Mood is stable, no new  changes  Breast: Denies any palpable lumps or discharge All other systems were reviewed with the patient and are negative.  PHYSICAL EXAMINATION: ECOG PERFORMANCE STATUS: 0 - Asymptomatic  Vitals:   12/26/21 1052  BP: (!) 163/90  Pulse: 65  Resp: 18  Temp: 97.9 F (36.6 C)  SpO2: 100%   Filed Weights   12/26/21 1052  Weight: 174 lb 12.8 oz (79.3 kg)    GENERAL:alert, no distress and comfortable SKIN: skin color, texture, turgor are normal, no rashes or significant lesions EYES: normal, conjunctiva are pink and non-injected, sclera clear OROPHARYNX:no exudate, no erythema and lips, buccal mucosa, and tongue normal  NECK: supple, thyroid normal size, non-tender, without nodularity LYMPH:  no palpable lymphadenopathy in the cervical, axillary or inguinal LUNGS: clear to auscultation and percussion with normal breathing  effort HEART: regular rate & rhythm and no murmurs and no lower extremity edema ABDOMEN:abdomen soft, non-tender and normal bowel sounds Musculoskeletal:no cyanosis of digits and no clubbing  PSYCH: alert & oriented x 3 with fluent speech NEURO: no focal motor/sensory deficits BREAST:Right breast healing well.  Postoperative changes.  No concern for infection.  LABORATORY DATA:  I have reviewed the data as listed Lab Results  Component Value Date   WBC 7.9 11/23/2021   HGB 13.1 11/23/2021   HCT 40.3 11/23/2021   MCV 93.9 11/23/2021   PLT 286 11/23/2021   Lab Results  Component Value Date   NA 139 11/23/2021   K 3.6 11/23/2021   CL 104 11/23/2021   CO2 29 11/23/2021    RADIOGRAPHIC STUDIES: I have personally reviewed the radiological reports and agreed with the findings in the report.  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  We have discussed the following details about DCIS and antiestrogen therapy.  Pathology review: I discussed with the patient the difference between DCIS and invasive breast cancer. It is considered a precancerous lesion. DCIS is classified as a Stage 0 breast cancer. It is generally detected through mammograms as calcifications. We discussed the significance of grades and its impact on prognosis. We also discussed the importance of ER and PR receptors and their implications to adjuvant treatment options. Prognosis of DCIS dependence on grade and degree of comedo necrosis. It is anticipated that if not treated, 20-30% of DCIS can develop into invasive breast cancer.  Recommendation: 1. Breast conserving surgery 2. Followed by adjuvant radiation therapy 3. Followed by antiestrogen therapy with tamoxifen/aromatase inhibitors based on menopausal status 5 years  Tamoxifen counseling: We discussed the risks and benefits of tamoxifen. These include but not limited to insomnia, hot flashes, mood changes, vaginal dryness, and weight gain. Although rare, serious side effects including endometrial cancer, risk of blood clots were also discussed. We strongly believe that the benefits far outweigh the risks. Patient understands these risks and consented to starting treatment. Planned treatment duration is 5 years.  Aromatase inhibitors counseling: We have discussed the mechanism of action of aromatase inhibitors today.  We have discussed adverse effects including but not limited to menopausal symptoms, increased risk of osteoporosis and fractures, cardiovascular events, arthralgias and myalgias.  We do believe that the benefits far outweigh the risks.  Plan treatment duration of 5 years.  Given her history of DVT, we have discussed about considering aromatase inhibitors for antiestrogen therapy.   She will return to clinic after radiation to initiate antiestrogen therapy.  Total time spent: 45 minutes including history, physical exam, review of records, counseling and coordination of care All questions were answered. The patient knows to call the clinic with any problems, questions or concerns.    Benay Pike, MD 12/26/21

## 2021-12-26 NOTE — Assessment & Plan Note (Signed)
This is a very pleasant 66 year old female patient with a newly diagnosed right breast DCIS status postlumpectomy, prognostic showed ER 10% positive strong staining PR 5% positive strong staining, will be starting adjuvant radiation soon referred to medical oncology for recommendations.  We have discussed the following details about DCIS and antiestrogen therapy.  Pathology review: I discussed with the patient the difference between DCIS and invasive breast cancer. It is considered a precancerous lesion. DCIS is classified as a Stage 0 breast cancer. It is generally detected through mammograms as calcifications. We discussed the significance of grades and its impact on prognosis. We also discussed the importance of ER and PR receptors and their implications to adjuvant treatment options. Prognosis of DCIS dependence on grade and degree of comedo necrosis. It is anticipated that if not treated, 20-30% of DCIS can develop into invasive breast cancer.  Recommendation: 1. Breast conserving surgery 2. Followed by adjuvant radiation therapy 3. Followed by antiestrogen therapy with tamoxifen/aromatase inhibitors based on menopausal status 5 years  Tamoxifen counseling: We discussed the risks and benefits of tamoxifen. These include but not limited to insomnia, hot flashes, mood changes, vaginal dryness, and weight gain. Although rare, serious side effects including endometrial cancer, risk of blood clots were also discussed. We strongly believe that the benefits far outweigh the risks. Patient understands these risks and consented to starting treatment. Planned treatment duration is 5 years.  Aromatase inhibitors counseling: We have discussed the mechanism of action of aromatase inhibitors today.  We have discussed adverse effects including but not limited to menopausal symptoms, increased risk of osteoporosis and fractures, cardiovascular events, arthralgias and myalgias.  We do believe that the benefits far  outweigh the risks.  Plan treatment duration of 5 years.  Given her history of DVT, we have discussed about considering aromatase inhibitors for antiestrogen therapy.  She will return to clinic after radiation to initiate antiestrogen therapy.

## 2021-12-27 ENCOUNTER — Encounter: Payer: Self-pay | Admitting: *Deleted

## 2021-12-29 ENCOUNTER — Ambulatory Visit
Admission: RE | Admit: 2021-12-29 | Discharge: 2021-12-29 | Disposition: A | Discharge status: Home / Self Care | Payer: No Typology Code available for payment source | Source: Ambulatory Visit | Attending: Radiation Oncology | Admitting: Radiation Oncology

## 2021-12-29 DIAGNOSIS — D0511 Intraductal carcinoma in situ of right breast: Secondary | ICD-10-CM | POA: Diagnosis not present

## 2022-01-01 ENCOUNTER — Inpatient Hospital Stay
Payer: No Typology Code available for payment source | Attending: Licensed Clinical Social Worker | Admitting: Licensed Clinical Social Worker

## 2022-01-01 NOTE — Progress Notes (Signed)
Amber Jackson  Initial Assessment   Amber Jackson is a 66 y.o. year old female contacted by phone. Clinical Social Jackson was referred by new patient protocol for assessment of psychosocial needs.   SDOH (Social Determinants of Health) assessments performed: Yes SDOH Interventions    Flowsheet Row Clinical Support from 01/01/2022 in [facility]  SDOH Interventions   Food Insecurity Interventions Intervention Not Indicated  Housing Interventions Intervention Not Indicated  Transportation Interventions Intervention Not Indicated  Financial Strain Interventions Intervention Not Indicated       SDOH Screenings   Food Insecurity: No Food Insecurity (01/01/2022)  Housing: Low Risk  (01/01/2022)  Transportation Needs: No Transportation Needs (01/01/2022)  Financial Resource Strain: Low Risk  (01/01/2022)  Tobacco Use: Low Risk  (12/26/2021)     Distress Screen completed: No    12/19/2021    9:21 AM  ONCBCN DISTRESS SCREENING  Screening Type Initial Screening  Distress experienced in past week (1-10) 6  Other Contact via phone      Family/Social Information:  Housing Arrangement: patient lives with spouse Family members/support persons in your life? 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  Income source: Employment Financial concerns: No Type of concern: None Food access concerns: no Religious or spiritual practice: Amber Jackson has helped her cope Services Currently in place:  n/a  Coping/ Adjustment to diagnosis: Patient understands treatment plan and what happens next? yes, she already had surgery and is doing well with healing and coping. She is ready for the next step (radiation) Concerns about diagnosis and/or treatment: I'm not especially worried about anything Patient reported stressors:  no major stressors reported Current coping skills/ strengths: Capable of independent living  , Communication skills , Religious Affiliation , and Supportive family/friends     SUMMARY: Current SDOH Barriers:  None identified today  Clinical Social Jackson Clinical Goal(s):  No clinical social Jackson goals at this time  Interventions: Informed patient of the support team roles and support services at [facility] Provided [place] contact information and encouraged patient to call with any questions or concerns   Follow Up Plan: Patient will contact CSW with any support or resource needs Patient verbalizes understanding of plan: Yes    Amber Jackson E Carlo Guevarra, LCSW

## 2022-01-07 DIAGNOSIS — D0511 Intraductal carcinoma in situ of right breast: Secondary | ICD-10-CM | POA: Insufficient documentation

## 2022-01-08 ENCOUNTER — Other Ambulatory Visit: Payer: Self-pay

## 2022-01-08 ENCOUNTER — Ambulatory Visit
Admission: RE | Admit: 2022-01-08 | Discharge: 2022-01-08 | Disposition: A | Discharge status: Home / Self Care | Payer: No Typology Code available for payment source | Source: Ambulatory Visit | Attending: Radiation Oncology | Admitting: Radiation Oncology

## 2022-01-08 DIAGNOSIS — D0511 Intraductal carcinoma in situ of right breast: Secondary | ICD-10-CM | POA: Diagnosis not present

## 2022-01-08 LAB — RAD ONC ARIA SESSION SUMMARY
Course Elapsed Days: 0
Plan Fractions Treated to Date: 1
Plan Prescribed Dose Per Fraction: 2.66 Gy
Plan Total Fractions Prescribed: 16
Plan Total Prescribed Dose: 42.56 Gy
Reference Point Dosage Given to Date: 2.66 Gy
Reference Point Session Dosage Given: 2.66 Gy
Session Number: 1

## 2022-01-09 ENCOUNTER — Ambulatory Visit
Admission: RE | Admit: 2022-01-09 | Discharge: 2022-01-09 | Disposition: A | Discharge status: Home / Self Care | Payer: No Typology Code available for payment source | Source: Ambulatory Visit | Attending: Radiation Oncology | Admitting: Radiation Oncology

## 2022-01-09 ENCOUNTER — Other Ambulatory Visit: Payer: Self-pay

## 2022-01-09 DIAGNOSIS — D0511 Intraductal carcinoma in situ of right breast: Secondary | ICD-10-CM | POA: Diagnosis not present

## 2022-01-09 LAB — RAD ONC ARIA SESSION SUMMARY
Course Elapsed Days: 1
Plan Fractions Treated to Date: 2
Plan Prescribed Dose Per Fraction: 2.66 Gy
Plan Total Fractions Prescribed: 16
Plan Total Prescribed Dose: 42.56 Gy
Reference Point Dosage Given to Date: 5.32 Gy
Reference Point Session Dosage Given: 2.66 Gy
Session Number: 2

## 2022-01-10 ENCOUNTER — Other Ambulatory Visit: Payer: Self-pay

## 2022-01-10 ENCOUNTER — Ambulatory Visit
Admission: RE | Admit: 2022-01-10 | Discharge: 2022-01-10 | Disposition: A | Discharge status: Home / Self Care | Payer: No Typology Code available for payment source | Source: Ambulatory Visit | Attending: Radiation Oncology | Admitting: Radiation Oncology

## 2022-01-10 DIAGNOSIS — D0511 Intraductal carcinoma in situ of right breast: Secondary | ICD-10-CM | POA: Diagnosis not present

## 2022-01-10 LAB — RAD ONC ARIA SESSION SUMMARY
Course Elapsed Days: 2
Plan Fractions Treated to Date: 3
Plan Prescribed Dose Per Fraction: 2.66 Gy
Plan Total Fractions Prescribed: 16
Plan Total Prescribed Dose: 42.56 Gy
Reference Point Dosage Given to Date: 7.98 Gy
Reference Point Session Dosage Given: 2.66 Gy
Session Number: 3

## 2022-01-11 ENCOUNTER — Ambulatory Visit
Admission: RE | Admit: 2022-01-11 | Discharge: 2022-01-11 | Disposition: A | Discharge status: Home / Self Care | Payer: No Typology Code available for payment source | Source: Ambulatory Visit | Attending: Radiation Oncology | Admitting: Radiation Oncology

## 2022-01-11 ENCOUNTER — Other Ambulatory Visit: Payer: Self-pay

## 2022-01-11 DIAGNOSIS — D0511 Intraductal carcinoma in situ of right breast: Secondary | ICD-10-CM | POA: Diagnosis not present

## 2022-01-11 LAB — RAD ONC ARIA SESSION SUMMARY
Course Elapsed Days: 3
Plan Fractions Treated to Date: 4
Plan Prescribed Dose Per Fraction: 2.66 Gy
Plan Total Fractions Prescribed: 16
Plan Total Prescribed Dose: 42.56 Gy
Reference Point Dosage Given to Date: 10.64 Gy
Reference Point Session Dosage Given: 2.66 Gy
Session Number: 4

## 2022-01-11 MED ORDER — ALRA NON-METALLIC DEODORANT (RAD-ONC)
1.0000 | Freq: Once | TOPICAL | Status: AC
  Administered 2022-01-11: 1 via TOPICAL

## 2022-01-11 MED ORDER — RADIAPLEXRX EX GEL: Freq: Once | CUTANEOUS | Status: AC

## 2022-01-12 ENCOUNTER — Ambulatory Visit
Admission: RE | Admit: 2022-01-12 | Discharge: 2022-01-12 | Disposition: A | Discharge status: Home / Self Care | Payer: No Typology Code available for payment source | Source: Ambulatory Visit | Attending: Radiation Oncology | Admitting: Radiation Oncology

## 2022-01-12 ENCOUNTER — Other Ambulatory Visit: Payer: Self-pay

## 2022-01-12 DIAGNOSIS — D0511 Intraductal carcinoma in situ of right breast: Secondary | ICD-10-CM | POA: Diagnosis not present

## 2022-01-12 LAB — RAD ONC ARIA SESSION SUMMARY
Course Elapsed Days: 4
Plan Fractions Treated to Date: 5
Plan Prescribed Dose Per Fraction: 2.66 Gy
Plan Total Fractions Prescribed: 16
Plan Total Prescribed Dose: 42.56 Gy
Reference Point Dosage Given to Date: 13.3 Gy
Reference Point Session Dosage Given: 2.66 Gy
Session Number: 5

## 2022-01-15 ENCOUNTER — Other Ambulatory Visit: Payer: Self-pay

## 2022-01-15 ENCOUNTER — Ambulatory Visit
Admission: RE | Admit: 2022-01-15 | Discharge: 2022-01-15 | Disposition: A | Discharge status: Home / Self Care | Payer: No Typology Code available for payment source | Source: Ambulatory Visit | Attending: Radiation Oncology | Admitting: Radiation Oncology

## 2022-01-15 DIAGNOSIS — D0511 Intraductal carcinoma in situ of right breast: Secondary | ICD-10-CM | POA: Diagnosis not present

## 2022-01-15 LAB — RAD ONC ARIA SESSION SUMMARY
Course Elapsed Days: 7
Plan Fractions Treated to Date: 6
Plan Prescribed Dose Per Fraction: 2.66 Gy
Plan Total Fractions Prescribed: 16
Plan Total Prescribed Dose: 42.56 Gy
Reference Point Dosage Given to Date: 15.96 Gy
Reference Point Session Dosage Given: 2.66 Gy
Session Number: 6

## 2022-01-16 ENCOUNTER — Ambulatory Visit
Admission: RE | Admit: 2022-01-16 | Discharge: 2022-01-16 | Disposition: A | Discharge status: Home / Self Care | Payer: No Typology Code available for payment source | Source: Ambulatory Visit | Attending: Radiation Oncology | Admitting: Radiation Oncology

## 2022-01-16 ENCOUNTER — Other Ambulatory Visit: Payer: Self-pay

## 2022-01-16 DIAGNOSIS — D0511 Intraductal carcinoma in situ of right breast: Secondary | ICD-10-CM | POA: Diagnosis not present

## 2022-01-16 LAB — RAD ONC ARIA SESSION SUMMARY
Course Elapsed Days: 8
Plan Fractions Treated to Date: 7
Plan Prescribed Dose Per Fraction: 2.66 Gy
Plan Total Fractions Prescribed: 16
Plan Total Prescribed Dose: 42.56 Gy
Reference Point Dosage Given to Date: 18.62 Gy
Reference Point Session Dosage Given: 2.66 Gy
Session Number: 7

## 2022-01-17 ENCOUNTER — Ambulatory Visit
Admission: RE | Admit: 2022-01-17 | Discharge: 2022-01-17 | Disposition: A | Discharge status: Home / Self Care | Payer: No Typology Code available for payment source | Source: Ambulatory Visit | Attending: Radiation Oncology | Admitting: Radiation Oncology

## 2022-01-17 ENCOUNTER — Other Ambulatory Visit (HOSPITAL_COMMUNITY): Payer: Self-pay

## 2022-01-17 ENCOUNTER — Other Ambulatory Visit: Payer: Self-pay

## 2022-01-17 DIAGNOSIS — D0511 Intraductal carcinoma in situ of right breast: Secondary | ICD-10-CM | POA: Diagnosis not present

## 2022-01-17 LAB — RAD ONC ARIA SESSION SUMMARY
Course Elapsed Days: 9
Plan Fractions Treated to Date: 8
Plan Prescribed Dose Per Fraction: 2.66 Gy
Plan Total Fractions Prescribed: 16
Plan Total Prescribed Dose: 42.56 Gy
Reference Point Dosage Given to Date: 21.28 Gy
Reference Point Session Dosage Given: 2.66 Gy
Session Number: 8

## 2022-01-18 ENCOUNTER — Other Ambulatory Visit: Payer: Self-pay

## 2022-01-18 ENCOUNTER — Ambulatory Visit
Admission: RE | Admit: 2022-01-18 | Discharge: 2022-01-18 | Disposition: A | Discharge status: Home / Self Care | Payer: No Typology Code available for payment source | Source: Ambulatory Visit | Attending: Radiation Oncology | Admitting: Radiation Oncology

## 2022-01-18 DIAGNOSIS — D0511 Intraductal carcinoma in situ of right breast: Secondary | ICD-10-CM | POA: Diagnosis not present

## 2022-01-18 LAB — RAD ONC ARIA SESSION SUMMARY
Course Elapsed Days: 10
Plan Fractions Treated to Date: 9
Plan Prescribed Dose Per Fraction: 2.66 Gy
Plan Total Fractions Prescribed: 16
Plan Total Prescribed Dose: 42.56 Gy
Reference Point Dosage Given to Date: 23.94 Gy
Reference Point Session Dosage Given: 2.66 Gy
Session Number: 9

## 2022-01-19 ENCOUNTER — Ambulatory Visit
Admission: RE | Admit: 2022-01-19 | Discharge: 2022-01-19 | Disposition: A | Discharge status: Home / Self Care | Payer: No Typology Code available for payment source | Source: Ambulatory Visit | Attending: Radiation Oncology | Admitting: Radiation Oncology

## 2022-01-19 ENCOUNTER — Other Ambulatory Visit: Payer: Self-pay

## 2022-01-19 DIAGNOSIS — D0511 Intraductal carcinoma in situ of right breast: Secondary | ICD-10-CM | POA: Diagnosis not present

## 2022-01-19 LAB — RAD ONC ARIA SESSION SUMMARY
Course Elapsed Days: 11
Plan Fractions Treated to Date: 10
Plan Prescribed Dose Per Fraction: 2.66 Gy
Plan Total Fractions Prescribed: 16
Plan Total Prescribed Dose: 42.56 Gy
Reference Point Dosage Given to Date: 26.6 Gy
Reference Point Session Dosage Given: 2.66 Gy
Session Number: 10

## 2022-01-21 DIAGNOSIS — D0511 Intraductal carcinoma in situ of right breast: Secondary | ICD-10-CM | POA: Diagnosis not present

## 2022-01-22 ENCOUNTER — Other Ambulatory Visit: Payer: Self-pay

## 2022-01-22 ENCOUNTER — Ambulatory Visit
Admission: RE | Admit: 2022-01-22 | Discharge: 2022-01-22 | Disposition: A | Discharge status: Home / Self Care | Payer: No Typology Code available for payment source | Source: Ambulatory Visit | Attending: Radiation Oncology | Admitting: Radiation Oncology

## 2022-01-22 DIAGNOSIS — D0511 Intraductal carcinoma in situ of right breast: Secondary | ICD-10-CM | POA: Diagnosis not present

## 2022-01-22 LAB — RAD ONC ARIA SESSION SUMMARY
Course Elapsed Days: 14
Plan Fractions Treated to Date: 11
Plan Prescribed Dose Per Fraction: 2.66 Gy
Plan Total Fractions Prescribed: 16
Plan Total Prescribed Dose: 42.56 Gy
Reference Point Dosage Given to Date: 29.26 Gy
Reference Point Session Dosage Given: 2.66 Gy
Session Number: 11

## 2022-01-23 ENCOUNTER — Ambulatory Visit
Admission: RE | Admit: 2022-01-23 | Discharge: 2022-01-23 | Disposition: A | Discharge status: Home / Self Care | Payer: No Typology Code available for payment source | Source: Ambulatory Visit | Attending: Radiation Oncology | Admitting: Radiation Oncology

## 2022-01-23 ENCOUNTER — Other Ambulatory Visit: Payer: Self-pay

## 2022-01-23 DIAGNOSIS — D0511 Intraductal carcinoma in situ of right breast: Secondary | ICD-10-CM | POA: Diagnosis not present

## 2022-01-23 LAB — RAD ONC ARIA SESSION SUMMARY
Course Elapsed Days: 15
Plan Fractions Treated to Date: 12
Plan Prescribed Dose Per Fraction: 2.66 Gy
Plan Total Fractions Prescribed: 16
Plan Total Prescribed Dose: 42.56 Gy
Reference Point Dosage Given to Date: 31.92 Gy
Reference Point Session Dosage Given: 2.66 Gy
Session Number: 12

## 2022-01-24 ENCOUNTER — Ambulatory Visit
Admission: RE | Admit: 2022-01-24 | Discharge: 2022-01-24 | Disposition: A | Discharge status: Home / Self Care | Payer: No Typology Code available for payment source | Source: Ambulatory Visit | Attending: Radiation Oncology | Admitting: Radiation Oncology

## 2022-01-24 ENCOUNTER — Other Ambulatory Visit: Payer: Self-pay

## 2022-01-24 DIAGNOSIS — D0511 Intraductal carcinoma in situ of right breast: Secondary | ICD-10-CM | POA: Diagnosis not present

## 2022-01-24 LAB — RAD ONC ARIA SESSION SUMMARY
Course Elapsed Days: 16
Plan Fractions Treated to Date: 13
Plan Prescribed Dose Per Fraction: 2.66 Gy
Plan Total Fractions Prescribed: 16
Plan Total Prescribed Dose: 42.56 Gy
Reference Point Dosage Given to Date: 34.58 Gy
Reference Point Session Dosage Given: 2.66 Gy
Session Number: 13

## 2022-01-25 ENCOUNTER — Ambulatory Visit
Admission: RE | Admit: 2022-01-25 | Discharge: 2022-01-25 | Disposition: A | Discharge status: Home / Self Care | Payer: No Typology Code available for payment source | Source: Ambulatory Visit | Attending: Radiation Oncology | Admitting: Radiation Oncology

## 2022-01-25 ENCOUNTER — Other Ambulatory Visit: Payer: Self-pay

## 2022-01-25 DIAGNOSIS — D0511 Intraductal carcinoma in situ of right breast: Secondary | ICD-10-CM | POA: Diagnosis not present

## 2022-01-25 LAB — RAD ONC ARIA SESSION SUMMARY
Course Elapsed Days: 17
Plan Fractions Treated to Date: 14
Plan Prescribed Dose Per Fraction: 2.66 Gy
Plan Total Fractions Prescribed: 16
Plan Total Prescribed Dose: 42.56 Gy
Reference Point Dosage Given to Date: 37.24 Gy
Reference Point Session Dosage Given: 2.66 Gy
Session Number: 14

## 2022-01-26 ENCOUNTER — Other Ambulatory Visit: Payer: Self-pay

## 2022-01-26 ENCOUNTER — Ambulatory Visit: Payer: No Typology Code available for payment source | Admitting: Radiation Oncology

## 2022-01-26 ENCOUNTER — Ambulatory Visit
Admission: RE | Admit: 2022-01-26 | Discharge: 2022-01-26 | Disposition: A | Discharge status: Home / Self Care | Payer: No Typology Code available for payment source | Source: Ambulatory Visit | Attending: Radiation Oncology | Admitting: Radiation Oncology

## 2022-01-26 DIAGNOSIS — D0511 Intraductal carcinoma in situ of right breast: Secondary | ICD-10-CM | POA: Diagnosis not present

## 2022-01-26 LAB — RAD ONC ARIA SESSION SUMMARY
Course Elapsed Days: 18
Plan Fractions Treated to Date: 15
Plan Prescribed Dose Per Fraction: 2.66 Gy
Plan Total Fractions Prescribed: 16
Plan Total Prescribed Dose: 42.56 Gy
Reference Point Dosage Given to Date: 39.9 Gy
Reference Point Session Dosage Given: 2.66 Gy
Session Number: 15

## 2022-01-29 ENCOUNTER — Ambulatory Visit
Admission: RE | Admit: 2022-01-29 | Discharge: 2022-01-29 | Disposition: A | Discharge status: Home / Self Care | Payer: No Typology Code available for payment source | Source: Ambulatory Visit | Attending: Radiation Oncology | Admitting: Radiation Oncology

## 2022-01-29 ENCOUNTER — Other Ambulatory Visit: Payer: Self-pay

## 2022-01-29 DIAGNOSIS — D0511 Intraductal carcinoma in situ of right breast: Secondary | ICD-10-CM | POA: Diagnosis not present

## 2022-01-29 LAB — RAD ONC ARIA SESSION SUMMARY
Course Elapsed Days: 21
Plan Fractions Treated to Date: 16
Plan Prescribed Dose Per Fraction: 2.66 Gy
Plan Total Fractions Prescribed: 16
Plan Total Prescribed Dose: 42.56 Gy
Reference Point Dosage Given to Date: 42.56 Gy
Reference Point Session Dosage Given: 2.66 Gy
Session Number: 16

## 2022-01-30 ENCOUNTER — Other Ambulatory Visit: Payer: Self-pay

## 2022-01-30 ENCOUNTER — Ambulatory Visit
Admission: RE | Admit: 2022-01-30 | Discharge: 2022-01-30 | Disposition: A | Discharge status: Home / Self Care | Payer: No Typology Code available for payment source | Source: Ambulatory Visit | Attending: Radiation Oncology | Admitting: Radiation Oncology

## 2022-01-30 DIAGNOSIS — D0511 Intraductal carcinoma in situ of right breast: Secondary | ICD-10-CM | POA: Diagnosis not present

## 2022-01-30 LAB — RAD ONC ARIA SESSION SUMMARY
Course Elapsed Days: 22
Plan Fractions Treated to Date: 1
Plan Prescribed Dose Per Fraction: 2 Gy
Plan Total Fractions Prescribed: 4
Plan Total Prescribed Dose: 8 Gy
Reference Point Dosage Given to Date: 2 Gy
Reference Point Session Dosage Given: 2 Gy
Session Number: 17

## 2022-01-31 ENCOUNTER — Other Ambulatory Visit: Payer: Self-pay

## 2022-01-31 ENCOUNTER — Ambulatory Visit
Admission: RE | Admit: 2022-01-31 | Discharge: 2022-01-31 | Disposition: A | Discharge status: Home / Self Care | Payer: No Typology Code available for payment source | Source: Ambulatory Visit | Attending: Radiation Oncology | Admitting: Radiation Oncology

## 2022-01-31 DIAGNOSIS — D0511 Intraductal carcinoma in situ of right breast: Secondary | ICD-10-CM | POA: Diagnosis not present

## 2022-01-31 LAB — RAD ONC ARIA SESSION SUMMARY
Course Elapsed Days: 23
Plan Fractions Treated to Date: 2
Plan Prescribed Dose Per Fraction: 2 Gy
Plan Total Fractions Prescribed: 4
Plan Total Prescribed Dose: 8 Gy
Reference Point Dosage Given to Date: 4 Gy
Reference Point Session Dosage Given: 2 Gy
Session Number: 18

## 2022-02-01 ENCOUNTER — Ambulatory Visit
Admission: RE | Admit: 2022-02-01 | Discharge: 2022-02-01 | Disposition: A | Discharge status: Home / Self Care | Payer: No Typology Code available for payment source | Source: Ambulatory Visit | Attending: Radiation Oncology | Admitting: Radiation Oncology

## 2022-02-01 ENCOUNTER — Other Ambulatory Visit: Payer: Self-pay

## 2022-02-01 DIAGNOSIS — D0511 Intraductal carcinoma in situ of right breast: Secondary | ICD-10-CM | POA: Diagnosis not present

## 2022-02-01 LAB — RAD ONC ARIA SESSION SUMMARY
Course Elapsed Days: 24
Plan Fractions Treated to Date: 3
Plan Prescribed Dose Per Fraction: 2 Gy
Plan Total Fractions Prescribed: 4
Plan Total Prescribed Dose: 8 Gy
Reference Point Dosage Given to Date: 6 Gy
Reference Point Session Dosage Given: 2 Gy
Session Number: 19

## 2022-02-02 ENCOUNTER — Encounter: Payer: Self-pay | Admitting: Radiation Oncology

## 2022-02-02 ENCOUNTER — Encounter: Payer: Self-pay | Admitting: *Deleted

## 2022-02-02 ENCOUNTER — Ambulatory Visit
Admission: RE | Admit: 2022-02-02 | Discharge: 2022-02-02 | Disposition: A | Discharge status: Home / Self Care | Payer: No Typology Code available for payment source | Source: Ambulatory Visit | Attending: Radiation Oncology | Admitting: Radiation Oncology

## 2022-02-02 ENCOUNTER — Other Ambulatory Visit: Payer: Self-pay

## 2022-02-02 DIAGNOSIS — D0511 Intraductal carcinoma in situ of right breast: Secondary | ICD-10-CM

## 2022-02-02 LAB — RAD ONC ARIA SESSION SUMMARY
Course Elapsed Days: 25
Plan Fractions Treated to Date: 4
Plan Prescribed Dose Per Fraction: 2 Gy
Plan Total Fractions Prescribed: 4
Plan Total Prescribed Dose: 8 Gy
Reference Point Dosage Given to Date: 8 Gy
Reference Point Session Dosage Given: 2 Gy
Session Number: 20

## 2022-02-02 MED ORDER — RADIAPLEXRX EX GEL: Freq: Once | CUTANEOUS | Status: AC

## 2022-02-12 NOTE — Progress Notes (Signed)
                                                                                                                                                             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   Intent: Curative  Radiation Treatment Dates: 01/08/2022 through 02/02/2022 Site Technique Total Dose (Gy) Dose per Fx (Gy) Completed Fx Beam Energies  Breast, Right: Breast_R 3D 42.56/42.56 2.66 16/16 10X  Breast, Right: Breast_R_Bst 3D 8/8 2 4/4 6X   Narrative: The patient tolerated radiation therapy relatively well. She developed anticipated skin changes in the treatment field.   Plan: The patient will receive a call in about one month from the radiation oncology department. She will continue follow up with Dr. Chryl Heck as well.   ________________________________________________    Carola Rhine, [facility]

## 2022-02-26 ENCOUNTER — Other Ambulatory Visit (HOSPITAL_COMMUNITY): Payer: Self-pay

## 2022-02-27 ENCOUNTER — Encounter: Payer: Self-pay | Admitting: Hematology and Oncology

## 2022-02-27 ENCOUNTER — Inpatient Hospital Stay
Payer: No Typology Code available for payment source | Attending: Licensed Clinical Social Worker | Admitting: Hematology and Oncology

## 2022-02-27 ENCOUNTER — Other Ambulatory Visit: Payer: Self-pay

## 2022-02-27 ENCOUNTER — Other Ambulatory Visit (HOSPITAL_COMMUNITY): Payer: Self-pay

## 2022-02-27 VITALS — BP 154/89 | HR 73 | Temp 97.8°F | Resp 16 | Ht 63.0 in | Wt 174.5 lb

## 2022-02-27 DIAGNOSIS — Z86718 Personal history of other venous thrombosis and embolism: Secondary | ICD-10-CM | POA: Insufficient documentation

## 2022-02-27 DIAGNOSIS — Z79811 Long term (current) use of aromatase inhibitors: Secondary | ICD-10-CM | POA: Diagnosis not present

## 2022-02-27 DIAGNOSIS — Z923 Personal history of irradiation: Secondary | ICD-10-CM | POA: Insufficient documentation

## 2022-02-27 DIAGNOSIS — Z8041 Family history of malignant neoplasm of ovary: Secondary | ICD-10-CM | POA: Insufficient documentation

## 2022-02-27 DIAGNOSIS — I10 Essential (primary) hypertension: Secondary | ICD-10-CM | POA: Insufficient documentation

## 2022-02-27 DIAGNOSIS — D0511 Intraductal carcinoma in situ of right breast: Secondary | ICD-10-CM | POA: Diagnosis not present

## 2022-02-27 MED ORDER — ANASTROZOLE 1 MG PO TABS
1.0000 mg | ORAL_TABLET | Freq: Every day | ORAL | 3 refills | Status: DC
  Filled 2022-02-27: qty 90, 90d supply, fill #0
  Filled 2022-05-30: qty 90, 90d supply, fill #1
  Filled 2022-08-29: qty 90, 90d supply, fill #2
  Filled 2022-11-02: qty 90, 90d supply, fill #3

## 2022-02-27 NOTE — Assessment & Plan Note (Addendum)
This is a very pleasant 66 year old female patient with a newly diagnosed right breast DCIS status postlumpectomy, prognostic showed ER 10% positive strong staining PR 5% positive strong staining, will be starting adjuvant radiation soon referred to medical oncology for recommendations. She is now here after completing adjuvant radiation. Given her history of DVT, we have discussed about initiating aromatase inhibitors for antiestrogen therapy.  I once again discussed mechanism of action of aromatase inhibitors, adverse effects from this including but not limited to hot flashes, arthralgias, bone loss, vaginal dryness etc.  Anastrozole prescribed to pharmacy of her choice.  She will start this and return to clinic in 3 months for survivorship visit.  Will continue her diagnostic mammograms for at least 2 years.  Baseline bone density ordered.  She does not have a known history of osteopenia or osteoporosis. Thank you for consulting [place] the care of this patient.  Please not hesitate to contact us with any additional questions or concerns.

## 2022-02-27 NOTE — Progress Notes (Signed)
[facility] NOTE  Patient Care Team: Kelton Pillar, MD as PCP - General (Family Medicine) Mauro Kaufmann, RN as Oncology Nurse Navigator Rockwell Germany, RN as Oncology Nurse Navigator Benay Pike, MD as Consulting Physician (Hematology and Oncology)  CHIEF COMPLAINTS/PURPOSE OF CONSULTATION:  Newly diagnosed breast cancer  HISTORY OF PRESENTING ILLNESS:  Amber Jackson 66 y.o. female is here because of recent diagnosis of right breast DCIS  I reviewed her records extensively and collaborated the history with the patient.  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   Diagnostic mammogram showed indeterminate loosely grouped calcifications measuring up to 2.2 cm in extent.  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 Blood clots run in the family, mom and son had it. Daughter is 32, son is 59.   She completed radiation from 01/08/2022 through 02/02/2022. She works for [employer], has been working there for almost 19 years.  She is getting ready to retire next month.   She tolerated radiation very well and is agreeable to start antiestrogen therapy as recommended.  No family history of breast cancer, ovarian cancer or colon cancer.  She is up-to-date with age-appropriate cancer screening.  Rest of the pertinent 10 point ROS reviewed and negative  MEDICAL HISTORY:  Past Medical History:  Diagnosis Date    Breast cancer ([place]) 2023   DVT (deep venous thrombosis) (H[facility])    right leg per patient   GERD (gastroesophageal reflux disease)    Heart murmur    pt had ECHO in 2010, no recent issues with this   Hypercholesterolemia    Hypertension    PONV (postoperative nausea and vomiting)    after appendectomy    SURGICAL HISTORY: Past Surgical History:  Procedure Laterality Date   APPENDECTOMY     "years ago"   BREAST LUMPECTOMY WITH RADIOACTIVE SEED LOCALIZATION Right 11/30/2021   Procedure: RIGHT BREAST RADIOACTIVE SEED LOCALIZED LUMPECTOMY;  Surgeon: Stark Klein, MD;  Location: [facility];  Service: General;  Laterality: Right;   COLONOSCOPY      SOCIAL HISTORY: Social History   Socioeconomic History   Marital status: Married    Spouse name: Not on file   Number of children: Not on file   Years of education: Not on file   Highest education level: Not on file  Occupational History   Not on file  Tobacco Use   Smoking status: Never   Smokeless tobacco: Never  Vaping Use   Vaping Use: Never used  Substance and Sexual Activity   Alcohol use: Never   Drug use: Never   Sexual activity: Not on file  Other Topics Concern   Not on file  Social History Narrative   Not on file   Social Determinants of Health   Financial Resource Strain: Low Risk  (01/01/2022)   Overall Financial Resource Strain (CARDIA)    Difficulty of Paying Living Expenses: Not very hard  Food Insecurity: No Food Insecurity (01/01/2022)   Hunger Vital Sign  Worried About Charity fundraiser in the Last Year: Never true    [place] in the Last Year: Never true  Transportation Needs: No Transportation Needs (01/01/2022)   PRAPARE - Hydrologist (Medical): No    Lack of Transportation (Non-Medical): No  Physical Activity: Not on file  Stress: Not on file  Social Connections: Not on file  Intimate Partner Violence: Not At Risk (12/19/2021)   Humiliation, Afraid, Rape, and Kick  questionnaire    Fear of Current or Ex-Partner: No    Emotionally Abused: No    Physically Abused: No    Sexually Abused: No    FAMILY HISTORY: Family History  Problem Relation Age of Onset   Ovarian cancer Sister     ALLERGIES:  is allergic to atorvastatin, simvastatin, and sulfa antibiotics.  MEDICATIONS:  Current Outpatient Medications  Medication Sig Dispense Refill   anastrozole (ARIMIDEX) 1 MG tablet Take 1 tablet (1 mg total) by mouth daily. 90 tablet 3   Ascorbic Acid (VITAMIN C) 100 MG CHEW Chew 100 mg by mouth daily.     atenolol (TENORMIN) 25 MG tablet Take 1 tablet (25 mg total) by mouth daily. 90 tablet 3   cetirizine (ZYRTEC) 10 MG tablet TAKE 1 TABLET BY MOUTH ONCE A DAY 90 90 tablet 3   Cholecalciferol (VITAMIN D) 50 MCG (2000 UT) tablet Take 2,000 Units by mouth daily.     Coenzyme Q10 (CO Q-10) 100 MG CAPS Take 100 mg by mouth daily.     cyclobenzaprine (FLEXERIL) 10 MG tablet Take 1 tablet (10 mg total) by mouth at bedtime as needed once a day for headache or spasm 90 tablet 1   Elderberry 500 MG CAPS Take 500 mg by mouth daily.     ezetimibe (ZETIA) 10 MG tablet Take 1 tablet (10 mg total) by mouth daily. 90 tablet 2   hydrochlorothiazide (MICROZIDE) 12.5 MG capsule Take 1 capsule by mouth in the morning Once a day 90 capsule 1   Multiple Vitamins-Minerals (HAIR/SKIN/NAILS) TABS Take 3 tablets by mouth daily.     Omega-3 Fatty Acids (FISH OIL) 1000 MG CAPS Take 2,000 mg by mouth daily.     omeprazole (PRILOSEC) 40 MG capsule Take 1 capsule by mouth twice daily. 60 capsule 3   Red Yeast Rice 600 MG CAPS Take 1,200 mg by mouth daily.     warfarin (COUMADIN) 4 MG tablet TAKE 2 TABLETS BY MOUTH DAILY MONDAY- FRIDAY, AND 1 TABLET DAILY SATURDAY AND SUNDAY AS DIRECTED. 145 tablet 5   Zinc 100 MG TABS Take 100 mg by mouth daily.     No current facility-administered medications for this visit.    REVIEW OF SYSTEMS:   Constitutional: Denies fevers, chills or abnormal  night sweats Eyes: Denies blurriness of vision, double vision or watery eyes Ears, nose, mouth, throat, and face: Denies mucositis or sore throat Respiratory: Denies cough, dyspnea or wheezes Cardiovascular: Denies palpitation, chest discomfort or lower extremity swelling Gastrointestinal:  Denies nausea, heartburn or change in bowel habits Skin: Denies abnormal skin rashes Lymphatics: Denies new lymphadenopathy or easy bruising Neurological:Denies numbness, tingling or new weaknesses Behavioral/Psych: Mood is stable, no new changes  Breast: Denies any palpable lumps or discharge All other systems were reviewed with the patient and are negative.  PHYSICAL EXAMINATION: ECOG PERFORMANCE STATUS: 0 - Asymptomatic  Vitals:   02/27/22 0906  BP: (!) 154/89  Pulse: 73  Resp: 16  Temp: 97.8 F (  36.6 C)  SpO2: 99%   Filed Weights   02/27/22 0906  Weight: 174 lb 8 oz (79.2 kg)    Physical Exam Constitutional:      Appearance: Normal appearance.  Chest:     Comments: Right breast status post adjuvant radiation, healing well Musculoskeletal:        General: Normal range of motion.     Cervical back: Normal range of motion and neck supple. No rigidity.  Lymphadenopathy:     Cervical: No cervical adenopathy.  Skin:    General: Skin is warm and dry.  Neurological:     Mental Status: She is alert.      LABORATORY DATA:  I have reviewed the data as listed Lab Results  Component Value Date   WBC 7.9 11/23/2021   HGB 13.1 11/23/2021   HCT 40.3 11/23/2021   MCV 93.9 11/23/2021   PLT 286 11/23/2021   Lab Results  Component Value Date   NA 139 11/23/2021   K 3.6 11/23/2021   CL 104 11/23/2021   CO2 29 11/23/2021    RADIOGRAPHIC STUDIES: I have personally reviewed the radiological reports and agreed with the findings in the report.  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 She is now here after completing adjuvant radiation. Given her history of DVT, we have discussed about initiating aromatase inhibitors for antiestrogen therapy.  I once again discussed mechanism of action of aromatase inhibitors, adverse effects from this including but not limited to hot flashes, arthralgias, bone loss, vaginal dryness etc.  Anastrozole prescribed to pharmacy of her choice.  She will start this and return to clinic in 3 months for survivorship visit.  Will continue her diagnostic mammograms for at least 2 years.  Baseline bone density ordered.  She does not have a known history of osteopenia or osteoporosis. Thank you for consulting [place] the care of this patient.  Please not hesitate to contact us with any additional questions or concerns.    Total time spent: 30 minutes including history, physical exam, review of records, counseling and coordination of care All questions were answered. The patient knows to call the clinic with any problems, questions or concerns.    Benay Pike, MD 02/27/22

## 2022-03-07 ENCOUNTER — Other Ambulatory Visit (HOSPITAL_COMMUNITY): Payer: Self-pay

## 2022-03-19 ENCOUNTER — Ambulatory Visit
Admission: RE | Admit: 2022-03-19 | Discharge: 2022-03-19 | Disposition: A | Discharge status: Home / Self Care | Payer: No Typology Code available for payment source | Source: Ambulatory Visit | Attending: Hematology and Oncology | Admitting: Hematology and Oncology

## 2022-03-19 DIAGNOSIS — D0511 Intraductal carcinoma in situ of right breast: Secondary | ICD-10-CM | POA: Insufficient documentation

## 2022-03-19 NOTE — Progress Notes (Signed)
  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   Intent: Curative  Radiation Treatment Dates: 01/08/2022 through 02/02/2022 Site Technique Total Dose (Gy) Dose per Fx (Gy) Completed Fx Beam Energies  Breast, Right: Breast_R 3D 42.56/42.56 2.66 16/16 10X  Breast, Right: Breast_R_Bst 3D 8/8 2 4/4 6X   (as documented in provider EOT note)   The patient was available for call today.   Symptoms of fatigue have improved since completing therapy.  Symptoms of skin changes have improved since completing therapy.  The patient was encouraged to avoid sun exposure in the area of prior treatment for up to one year following radiation with either sunscreen or by the style of clothing worn in the sun.  The patient has a scheduled follow up with her medical oncologist Dr. Chryl Heck for ongoing surveillance, and was encouraged to call if she develops concerns or questions regarding radiation.  This concludes the interview.   Leandra Kern, LPN

## 2022-03-21 DIAGNOSIS — Z7901 Long term (current) use of anticoagulants: Secondary | ICD-10-CM | POA: Diagnosis not present

## 2022-03-21 DIAGNOSIS — L918 Other hypertrophic disorders of the skin: Secondary | ICD-10-CM | POA: Diagnosis not present

## 2022-04-11 ENCOUNTER — Other Ambulatory Visit (HOSPITAL_COMMUNITY): Payer: Self-pay | Admitting: Internal Medicine

## 2022-04-11 ENCOUNTER — Other Ambulatory Visit (HOSPITAL_COMMUNITY): Payer: Self-pay

## 2022-04-11 MED ORDER — WARFARIN SODIUM 4 MG PO TABS
ORAL_TABLET | ORAL | 1 refills | Status: AC
  Filled 2022-04-11: qty 156, 90d supply, fill #0
  Filled 2022-07-02: qty 156, 90d supply, fill #1

## 2022-04-12 ENCOUNTER — Other Ambulatory Visit (HOSPITAL_COMMUNITY): Payer: Self-pay

## 2022-04-18 ENCOUNTER — Other Ambulatory Visit (HOSPITAL_COMMUNITY): Payer: Self-pay

## 2022-04-19 ENCOUNTER — Other Ambulatory Visit (HOSPITAL_COMMUNITY): Payer: Self-pay

## 2022-04-19 MED ORDER — HYDROCHLOROTHIAZIDE 12.5 MG PO CAPS
12.5000 mg | ORAL_CAPSULE | Freq: Every day | ORAL | 1 refills | Status: DC
  Filled 2022-04-19: qty 90, 90d supply, fill #0
  Filled 2022-07-24: qty 90, 90d supply, fill #1

## 2022-04-19 MED ORDER — OMEPRAZOLE 40 MG PO CPDR
40.0000 mg | DELAYED_RELEASE_CAPSULE | Freq: Two times a day (BID) | ORAL | 1 refills | Status: DC
  Filled 2022-04-19: qty 180, 90d supply, fill #0
  Filled 2022-10-15: qty 180, 90d supply, fill #1

## 2022-04-20 DIAGNOSIS — Z7901 Long term (current) use of anticoagulants: Secondary | ICD-10-CM | POA: Diagnosis not present

## 2022-05-21 DIAGNOSIS — Z7901 Long term (current) use of anticoagulants: Secondary | ICD-10-CM | POA: Diagnosis not present

## 2022-05-22 ENCOUNTER — Other Ambulatory Visit (HOSPITAL_COMMUNITY): Payer: Self-pay

## 2022-05-30 ENCOUNTER — Encounter: Payer: Self-pay | Admitting: Adult Health

## 2022-05-30 ENCOUNTER — Other Ambulatory Visit: Payer: Self-pay

## 2022-05-30 ENCOUNTER — Other Ambulatory Visit (HOSPITAL_COMMUNITY): Payer: Self-pay

## 2022-05-30 ENCOUNTER — Inpatient Hospital Stay: Payer: Medicare HMO | Attending: Adult Health | Admitting: Adult Health

## 2022-05-30 VITALS — BP 135/72 | HR 66 | Temp 97.9°F | Resp 16 | Ht 63.0 in | Wt 177.9 lb

## 2022-05-30 DIAGNOSIS — I10 Essential (primary) hypertension: Secondary | ICD-10-CM | POA: Insufficient documentation

## 2022-05-30 DIAGNOSIS — D0511 Intraductal carcinoma in situ of right breast: Secondary | ICD-10-CM | POA: Insufficient documentation

## 2022-05-30 DIAGNOSIS — Z8041 Family history of malignant neoplasm of ovary: Secondary | ICD-10-CM | POA: Insufficient documentation

## 2022-05-30 DIAGNOSIS — Z79811 Long term (current) use of aromatase inhibitors: Secondary | ICD-10-CM | POA: Insufficient documentation

## 2022-05-30 NOTE — Progress Notes (Unsigned)
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   Diagnostic mammogram showed indeterminate loosely grouped calcifications measuring up to 2.2 cm in extent.  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 Hardeman to review her survivorship care plan detailing her treatment course for breast cancer, as well as monitoring long-term side effects of that treatment, education regarding health maintenance, screening, and overall wellness and health promotion.     Overall, Ms. Sudds reports feeling quite well.  She is taking Anastrozole daily and experiences hot flashes.  She occasionally wakes up hot, but manages this with a hand fan and is able to go back to sleep.   REVIEW OF SYSTEMS:  Review of Systems  Constitutional:  Negative for appetite change, chills, fatigue, fever and unexpected weight change.  HENT:   Negative for hearing loss, lump/mass and trouble swallowing.   Eyes:  Negative for eye problems and icterus.  Respiratory:  Negative for chest tightness, cough and shortness of breath.   Cardiovascular:  Negative for chest pain, leg swelling and palpitations.  Gastrointestinal:  Negative for  abdominal distention, abdominal pain, constipation, diarrhea, nausea and vomiting.  Endocrine: Positive for hot flashes.  Genitourinary:  Negative for difficulty urinating.   Musculoskeletal:  Negative for arthralgias.  Skin:  Negative for itching and rash.  Neurological:  Negative for dizziness, extremity weakness, headaches and numbness.  Hematological:  Negative for adenopathy. Does not bruise/bleed easily.  Psychiatric/Behavioral:  Negative for depression. The patient is not nervous/anxious.   Breast: Denies any new nodularity, masses, tenderness, nipple changes, or nipple discharge.    PAST MEDICAL/SURGICAL HISTORY:  Past Medical History:  Diagnosis Date   Breast cancer ([place]) 2023   DVT (deep venous thrombosis) ([facility])    right leg per patient   GERD (gastroesophageal reflux disease)    Heart murmur    pt had ECHO in 2010, no recent issues with this   Hypercholesterolemia    Hypertension    PONV (postoperative nausea and vomiting)    after appendectomy   Past Surgical History:  Procedure Laterality Date   APPENDECTOMY     "years ago"   BREAST LUMPECTOMY WITH RADIOACTIVE SEED LOCALIZATION Right 11/30/2021   Procedure: RIGHT BREAST RADIOACTIVE SEED LOCALIZED LUMPECTOMY;  Surgeon: Stark Klein, MD;  Location: [facility];  Service: General;  Laterality: Right;   COLONOSCOPY       ALLERGIES:  Allergies  Allergen Reactions   Atorvastatin Other (See Comments)    myalgia   Simvastatin Other (See Comments)    myalgia   Sulfa Antibiotics Nausea And Vomiting     CURRENT MEDICATIONS:  Outpatient Encounter Medications as of 05/30/2022  Medication Sig Note   anastrozole (ARIMIDEX) 1 MG tablet Take 1  tablet (1 mg total) by mouth daily.    Ascorbic Acid (VITAMIN C) 100 MG CHEW Chew 100 mg by mouth daily.    atenolol (TENORMIN) 25 MG tablet Take 1 tablet (25 mg total) by mouth daily.    Cholecalciferol (VITAMIN D) 50 MCG (2000 UT) tablet Take 2,000 Units by mouth daily.    Coenzyme  Q10 (CO Q-10) 100 MG CAPS Take 100 mg by mouth daily.    cyclobenzaprine (FLEXERIL) 10 MG tablet Take 1 tablet (10 mg total) by mouth at bedtime as needed once a day for headache or spasm    Elderberry 500 MG CAPS Take 500 mg by mouth daily.    ezetimibe (ZETIA) 10 MG tablet Take 1 tablet (10 mg total) by mouth daily.    hydrochlorothiazide (MICROZIDE) 12.5 MG capsule Take 1 capsule by mouth in the morning Once a day    hydrochlorothiazide (MICROZIDE) 12.5 MG capsule Take 1 capsule (12.5 mg total) by mouth daily.    Multiple Vitamins-Minerals (HAIR/SKIN/NAILS) TABS Take 3 tablets by mouth daily.    Omega-3 Fatty Acids (FISH OIL) 1000 MG CAPS Take 2,000 mg by mouth daily.    omeprazole (PRILOSEC) 40 MG capsule Take 1 capsule by mouth twice daily.    omeprazole (PRILOSEC) 40 MG capsule Take 1 capsule (40 mg total) by mouth 2 (two) times daily    Red Yeast Rice 600 MG CAPS Take 1,200 mg by mouth daily.    warfarin (COUMADIN) 4 MG tablet TAKE 2 TABLETS BY MOUTH DAILY MONDAY- FRIDAY, AND 1 TABLET DAILY SATURDAY AND SUNDAY AS DIRECTED.    warfarin (COUMADIN) 4 MG tablet TAKE 2 TABLETS BY MOUTH DAILY MONDAY- FRIDAY, AND 1 TABLET DAILY SATURDAY AND SUNDAY AS DIRECTED.    Zinc 100 MG TABS Take 100 mg by mouth daily. 11/21/2021: Gummy   cetirizine (ZYRTEC) 10 MG tablet TAKE 1 TABLET BY MOUTH ONCE A DAY 90    No facility-administered encounter medications on file as of 05/30/2022.     ONCOLOGIC FAMILY HISTORY:  Family History  Problem Relation Age of Onset   Ovarian cancer Sister       SOCIAL HISTORY:  Social History   Socioeconomic History   Marital status: Married    Spouse name: Not on file   Number of children: Not on file   Years of education: Not on file   Highest education level: Not on file  Occupational History   Not on file  Tobacco Use   Smoking status: Never   Smokeless tobacco: Never  Vaping Use   Vaping Use: Never used  Substance and Sexual Activity   Alcohol use: Never    Drug use: Never   Sexual activity: Not on file  Other Topics Concern   Not on file  Social History Narrative   Not on file   Social Determinants of Health   Financial Resource Strain: Low Risk  (01/01/2022)   Overall Financial Resource Strain (CARDIA)    Difficulty of Paying Living Expenses: Not very hard  Food Insecurity: No Food Insecurity (01/01/2022)   Hunger Vital Sign    Worried About Running Out of Food in the Last Year: Never true    Ran Out of Food in the Last Year: Never true  Transportation Needs: No Transportation Needs (01/01/2022)   PRAPARE - Hydrologist (Medical): No    Lack of Transportation (Non-Medical): No  Physical Activity: Not on file  Stress: Not on file  Social Connections: Not  on file  Intimate Partner Violence: Not At Risk (12/19/2021)   Humiliation, Afraid, Rape, and Kick questionnaire    Fear of Current or Ex-Partner: No    Emotionally Abused: No    Physically Abused: No    Sexually Abused: No     OBSERVATIONS/OBJECTIVE:  BP 135/72 (BP Location: Left Arm, Patient Position: Sitting)   Pulse 66   Temp 97.9 F (36.6 C) (Temporal)   Resp 16   Ht '5\' 3"'$  (1.6 m)   Wt 177 lb 14.4 oz (80.7 kg)   SpO2 100%   BMI 31.51 kg/m  GENERAL: Patient is a well appearing female in no acute distress HEENT:  Sclerae anicteric.  Oropharynx clear and moist. No ulcerations or evidence of oropharyngeal candidiasis. Neck is supple.  NODES:  No cervical, supraclavicular, or axillary lymphadenopathy palpated.  BREAST EXAM: Post right breast lumpectomy and radiation no sign of local recurrence left breast is benign. LUNGS:  Clear to auscultation bilaterally.  No wheezes or rhonchi. HEART:  Regular rate and rhythm. No murmur appreciated. ABDOMEN:  Soft, nontender.  Positive, normoactive bowel sounds. No organomegaly palpated. MSK:  No focal spinal tenderness to palpation. Full range of motion bilaterally in the upper extremities. EXTREMITIES:   No peripheral edema.   SKIN:  Clear with no obvious rashes or skin changes. No nail dyscrasia. NEURO:  Nonfocal. Well oriented.  Appropriate affect.  LABORATORY DATA:  None for this visit.  DIAGNOSTIC IMAGING:  None for this visit.      ASSESSMENT AND PLAN:  Ms.. Lottman is a pleasant 67 y.o. female with Stage 0 right breast DCIS, ER+/PR+, diagnosed in 08/2021, treated with lumpectomy, adjuvant radiation therapy, and anti-estrogen therapy with anastrozole beginning in #2023.  She presents to the Survivorship Clinic for our initial meeting and routine follow-up post-completion of treatment for breast cancer.    1. Stage 0 right breast cancer:  Ms. Grunert is continuing to recover from definitive treatment for breast cancer. She will follow-up with her medical oncologist, Dr. Chryl Heck in 11/2022 with history and physical exam per surveillance protocol.  She will continue her anti-estrogen therapy with Anastrozole. Thus far, she is tolerating the Anastrozole well, with minimal side effects. She was instructed to make Dr. Lindi Adie or myself aware if she begins to experience any worsening side effects of the medication and I could see her back in clinic to help manage those side effects, as needed. Her mammogram is due 09/2022; orders placed today. Today, a comprehensive survivorship care plan and treatment summary was reviewed with the patient today detailing her breast cancer diagnosis, treatment course, potential late/long-term effects of treatment, appropriate follow-up care with recommendations for the future, and patient education resources.  A copy of this summary, along with a letter will be sent to the patient's primary care provider via mail/fax/In Basket message after today's visit.    2. Bone health:  Given Ms. Guinther's age/history of breast cancer and her current treatment regimen including anti-estrogen therapy with anastrozole, she is at risk for bone demineralization.  She is scheduled for bone  density testing on September 03, 2022.  She was given education on specific activities to promote bone health.  3. Cancer screening:  Due to Ms. Milley's history and her age, she should receive screening for skin cancers, colon cancer, and gynecologic cancers.  The information and recommendations are listed on the patient's comprehensive care plan/treatment summary and were reviewed in detail with the patient.    4. Health maintenance and wellness promotion:  Ms. Carothers was encouraged to consume 5-7 servings of fruits and vegetables per day. We reviewed the "Nutrition Rainbow" handout.  We discussed exercise and the American Cancer Society study that demonstrated breast cancer risk reduction with moderate to vigorous exercise 150 to 300 minutes a week.Marland Kitchen   She was instructed to limit her alcohol consumption and continue to abstain from tobacco use.     5. Support services/counseling: It is not uncommon for this period of the patient's cancer care trajectory to be one of many emotions and stressors.   She was given information regarding our available services and encouraged to contact me with any questions or for help enrolling in any of our support group/programs.    Follow up instructions:    -Return to cancer center in 6 months for follow-up with Dr. Chryl Heck -Mammogram and bone density due in June 2024 -She is welcome to return back to the Survivorship Clinic at any time; no additional follow-up needed at this time.  -Consider referral back to survivorship as a long-term survivor for continued surveillance  The patient was provided an opportunity to ask questions and all were answered. The patient agreed with the plan and demonstrated an understanding of the instructions.   Total encounter time:40 minutes*in face-to-face visit time, chart review, lab review, care coordination, order entry, and documentation of the encounter time.    Wilber Bihari, NP 05/30/22 10:05 AM Medical Oncology and  Hematology [facility] [place], [place] [address] Tel. [phone]    Fax. [phone]  *Total Encounter Time as defined by the Centers for Medicare and Medicaid Services includes, in addition to the face-to-face time of a patient visit (documented in the note above) non-face-to-face time: obtaining and reviewing outside history, ordering and reviewing medications, tests or procedures, care coordination (communications with other health care professionals or caregivers) and documentation in the medical record.

## 2022-06-19 ENCOUNTER — Other Ambulatory Visit (HOSPITAL_COMMUNITY): Payer: Self-pay

## 2022-06-19 MED ORDER — CYCLOBENZAPRINE HCL 10 MG PO TABS
10.0000 mg | ORAL_TABLET | Freq: Every evening | ORAL | 1 refills | Status: AC | PRN
  Filled 2022-06-19: qty 30, 30d supply, fill #0

## 2022-06-20 DIAGNOSIS — Z7901 Long term (current) use of anticoagulants: Secondary | ICD-10-CM | POA: Diagnosis not present

## 2022-07-02 ENCOUNTER — Other Ambulatory Visit (HOSPITAL_COMMUNITY): Payer: Self-pay

## 2022-07-04 DIAGNOSIS — Z7901 Long term (current) use of anticoagulants: Secondary | ICD-10-CM | POA: Diagnosis not present

## 2022-07-09 DIAGNOSIS — C50411 Malignant neoplasm of upper-outer quadrant of right female breast: Secondary | ICD-10-CM | POA: Diagnosis not present

## 2022-07-09 DIAGNOSIS — Z170 Estrogen receptor positive status [ER+]: Secondary | ICD-10-CM | POA: Diagnosis not present

## 2022-07-24 ENCOUNTER — Other Ambulatory Visit (HOSPITAL_COMMUNITY): Payer: Self-pay

## 2022-07-24 MED ORDER — EZETIMIBE 10 MG PO TABS
10.0000 mg | ORAL_TABLET | Freq: Every day | ORAL | 0 refills | Status: DC
  Filled 2022-07-24: qty 90, 90d supply, fill #0

## 2022-08-01 DIAGNOSIS — Z7901 Long term (current) use of anticoagulants: Secondary | ICD-10-CM | POA: Diagnosis not present

## 2022-08-29 ENCOUNTER — Other Ambulatory Visit (HOSPITAL_COMMUNITY): Payer: Self-pay

## 2022-08-29 DIAGNOSIS — Z86711 Personal history of pulmonary embolism: Secondary | ICD-10-CM | POA: Diagnosis not present

## 2022-08-29 DIAGNOSIS — Z7901 Long term (current) use of anticoagulants: Secondary | ICD-10-CM | POA: Diagnosis not present

## 2022-08-29 DIAGNOSIS — C50911 Malignant neoplasm of unspecified site of right female breast: Secondary | ICD-10-CM | POA: Diagnosis not present

## 2022-08-29 DIAGNOSIS — K219 Gastro-esophageal reflux disease without esophagitis: Secondary | ICD-10-CM | POA: Diagnosis not present

## 2022-08-29 DIAGNOSIS — D6851 Activated protein C resistance: Secondary | ICD-10-CM | POA: Diagnosis not present

## 2022-08-29 DIAGNOSIS — N183 Chronic kidney disease, stage 3 unspecified: Secondary | ICD-10-CM | POA: Diagnosis not present

## 2022-08-29 DIAGNOSIS — E7800 Pure hypercholesterolemia, unspecified: Secondary | ICD-10-CM | POA: Diagnosis not present

## 2022-08-29 DIAGNOSIS — J309 Allergic rhinitis, unspecified: Secondary | ICD-10-CM | POA: Diagnosis not present

## 2022-08-29 DIAGNOSIS — I129 Hypertensive chronic kidney disease with stage 1 through stage 4 chronic kidney disease, or unspecified chronic kidney disease: Secondary | ICD-10-CM | POA: Diagnosis not present

## 2022-08-29 DIAGNOSIS — D6869 Other thrombophilia: Secondary | ICD-10-CM | POA: Diagnosis not present

## 2022-08-29 MED ORDER — ATENOLOL 25 MG PO TABS
25.0000 mg | ORAL_TABLET | Freq: Every day | ORAL | 1 refills | Status: DC
  Filled 2022-08-29: qty 90, 90d supply, fill #0
  Filled 2022-12-04: qty 90, 90d supply, fill #1

## 2022-09-03 ENCOUNTER — Ambulatory Visit
Admission: RE | Admit: 2022-09-03 | Discharge: 2022-09-03 | Disposition: A | Discharge status: Home / Self Care | Payer: Medicare HMO | Source: Ambulatory Visit | Attending: Hematology and Oncology | Admitting: Hematology and Oncology

## 2022-09-03 DIAGNOSIS — Z853 Personal history of malignant neoplasm of breast: Secondary | ICD-10-CM | POA: Diagnosis not present

## 2022-09-03 DIAGNOSIS — D0511 Intraductal carcinoma in situ of right breast: Secondary | ICD-10-CM

## 2022-09-03 DIAGNOSIS — Z7983 Long term (current) use of bisphosphonates: Secondary | ICD-10-CM | POA: Diagnosis not present

## 2022-09-03 DIAGNOSIS — Z8781 Personal history of (healed) traumatic fracture: Secondary | ICD-10-CM | POA: Diagnosis not present

## 2022-09-03 DIAGNOSIS — E349 Endocrine disorder, unspecified: Secondary | ICD-10-CM | POA: Diagnosis not present

## 2022-09-20 ENCOUNTER — Ambulatory Visit
Admission: RE | Admit: 2022-09-20 | Discharge: 2022-09-20 | Disposition: A | Discharge status: Home / Self Care | Payer: Medicare HMO | Source: Ambulatory Visit | Attending: Adult Health | Admitting: Adult Health

## 2022-09-20 DIAGNOSIS — Z853 Personal history of malignant neoplasm of breast: Secondary | ICD-10-CM | POA: Diagnosis not present

## 2022-09-20 DIAGNOSIS — D0511 Intraductal carcinoma in situ of right breast: Secondary | ICD-10-CM

## 2022-09-20 HISTORY — DX: Personal history of irradiation: Z92.3

## 2022-10-01 DIAGNOSIS — Z7901 Long term (current) use of anticoagulants: Secondary | ICD-10-CM | POA: Diagnosis not present

## 2022-10-15 ENCOUNTER — Other Ambulatory Visit (HOSPITAL_COMMUNITY): Payer: Self-pay

## 2022-10-15 MED ORDER — EZETIMIBE 10 MG PO TABS
10.0000 mg | ORAL_TABLET | Freq: Every day | ORAL | 3 refills | Status: DC
  Filled 2022-10-15: qty 90, 90d supply, fill #0
  Filled 2023-01-15: qty 90, 90d supply, fill #1
  Filled 2023-04-19: qty 90, 90d supply, fill #2
  Filled 2023-07-22: qty 90, 90d supply, fill #3

## 2022-10-15 MED ORDER — HYDROCHLOROTHIAZIDE 12.5 MG PO CAPS
12.5000 mg | ORAL_CAPSULE | Freq: Every day | ORAL | 3 refills | Status: DC
  Filled 2022-10-15: qty 90, 90d supply, fill #0
  Filled 2023-01-16: qty 90, 90d supply, fill #1
  Filled 2023-04-19: qty 90, 90d supply, fill #2
  Filled 2023-07-22: qty 90, 90d supply, fill #3

## 2022-10-15 MED ORDER — WARFARIN SODIUM 4 MG PO TABS
8.0000 mg | ORAL_TABLET | Freq: Every day | ORAL | 3 refills | Status: DC
  Filled 2022-10-15: qty 156, 90d supply, fill #0
  Filled 2023-01-15: qty 156, 90d supply, fill #1
  Filled 2023-04-19: qty 156, 90d supply, fill #2
  Filled 2023-07-22: qty 156, 90d supply, fill #3

## 2022-10-29 DIAGNOSIS — Z7901 Long term (current) use of anticoagulants: Secondary | ICD-10-CM | POA: Diagnosis not present

## 2022-11-02 ENCOUNTER — Other Ambulatory Visit (HOSPITAL_COMMUNITY): Payer: Self-pay

## 2022-11-26 DIAGNOSIS — Z7901 Long term (current) use of anticoagulants: Secondary | ICD-10-CM | POA: Diagnosis not present

## 2022-11-29 ENCOUNTER — Other Ambulatory Visit (HOSPITAL_COMMUNITY): Payer: Self-pay

## 2022-11-29 ENCOUNTER — Inpatient Hospital Stay: Payer: Medicare HMO | Attending: Hematology and Oncology | Admitting: Hematology and Oncology

## 2022-11-29 VITALS — BP 143/78 | HR 62 | Temp 97.9°F | Resp 19 | Wt 179.4 lb

## 2022-11-29 DIAGNOSIS — D0511 Intraductal carcinoma in situ of right breast: Secondary | ICD-10-CM

## 2022-11-29 DIAGNOSIS — R232 Flushing: Secondary | ICD-10-CM | POA: Insufficient documentation

## 2022-11-29 DIAGNOSIS — Z923 Personal history of irradiation: Secondary | ICD-10-CM | POA: Insufficient documentation

## 2022-11-29 DIAGNOSIS — Z8041 Family history of malignant neoplasm of ovary: Secondary | ICD-10-CM | POA: Diagnosis not present

## 2022-11-29 DIAGNOSIS — Z86718 Personal history of other venous thrombosis and embolism: Secondary | ICD-10-CM | POA: Diagnosis not present

## 2022-11-29 MED ORDER — ANASTROZOLE 1 MG PO TABS
1.0000 mg | ORAL_TABLET | Freq: Every day | ORAL | 3 refills | Status: DC
  Filled 2022-11-29: qty 90, 90d supply, fill #0
  Filled 2023-02-26: qty 90, 90d supply, fill #1
  Filled 2023-06-01: qty 90, 90d supply, fill #2
  Filled 2023-09-05: qty 90, 90d supply, fill #3

## 2022-11-29 NOTE — Progress Notes (Signed)
[facility] CONSULT NOTE  Patient Care Team: Ollen Bowl, MD as PCP - General (Internal Medicine) Rachel Moulds, MD as Consulting Physician (Hematology and Oncology) Dorothy Puffer, MD as Consulting Physician (Radiation Oncology) Almond Lint, MD as Consulting Physician (General Surgery)  CHIEF COMPLAINTS/PURPOSE OF CONSULTATION:  Newly diagnosed breast cancer  HISTORY OF PRESENTING ILLNESS:  Amber Jackson 67 y.o. female is here because of recent diagnosis of right breast DCIS  I reviewed her records extensively and collaborated the history with the patient.  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   Diagnostic mammogram showed indeterminate loosely grouped calcifications measuring up to 2.2 cm in extent.  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 Blood clots run in the family, mom and son had it. Daughter is 57, son is 33.   She completed radiation from 01/08/2022 through  02/02/2022. She started anastrozole 03/02/2022. She has been doing really well, she has noticed some hot flashes but these are not intolerable.  Otherwise she is now retired, having some free time but has been taking care of her mother who has dementia and is 49 years old.  She still has more time for herself, has been reading a bit and was walking every day.  She denies any breast changes. No family history of breast cancer, ovarian cancer or colon cancer.  She is up-to-date with age-appropriate cancer screening.  Rest of the pertinent 10 point ROS reviewed and negative  MEDICAL HISTORY:  Past Medical History:  Diagnosis Date   Breast cancer ([facility]) 2023   DVT (deep venous thrombosis) ([facility])    right leg per patient   GERD (gastroesophageal reflux disease)    Heart murmur    pt had ECHO in 2010, no recent issues with this   Hypercholesterolemia    Hypertension    Personal history of radiation therapy    PONV (postoperative nausea and vomiting)    after appendectomy    SURGICAL HISTORY: Past Surgical History:  Procedure Laterality Date   APPENDECTOMY     "years ago"   BREAST LUMPECTOMY     BREAST LUMPECTOMY WITH RADIOACTIVE SEED LOCALIZATION Right 11/30/2021   Procedure: RIGHT BREAST RADIOACTIVE SEED LOCALIZED LUMPECTOMY;  Surgeon: Almond Lint, MD;  Location: [facility];  Service: General;  Laterality: Right;   COLONOSCOPY      SOCIAL HISTORY: Social History   Socioeconomic History   Marital status: Married    Spouse name: Not on file   Number of children: Not on file   Years  of education: Not on file   Highest education level: Not on file  Occupational History   Not on file  Tobacco Use   Smoking status: Never   Smokeless tobacco: Never  Vaping Use   Vaping status: Never Used  Substance and Sexual Activity   Alcohol use: Never   Drug use: Never   Sexual activity: Not on file  Other Topics Concern   Not on file  Social History Narrative   Not on file   Social  Determinants of Health   Financial Resource Strain: Low Risk  (01/01/2022)   Overall Financial Resource Strain (CARDIA)    Difficulty of Paying Living Expenses: Not very hard  Food Insecurity: No Food Insecurity (01/01/2022)   Hunger Vital Sign    Worried About Running Out of Food in the Last Year: Never true    Ran Out of Food in the Last Year: Never true  Transportation Needs: No Transportation Needs (01/01/2022)   PRAPARE - Administrator, Civil Service (Medical): No    Lack of Transportation (Non-Medical): No  Physical Activity: Not on file  Stress: Not on file  Social Connections: Not on file  Intimate Partner Violence: Not At Risk (12/19/2021)   Humiliation, Afraid, Rape, and Kick questionnaire    Fear of Current or Ex-Partner: No    Emotionally Abused: No    Physically Abused: No    Sexually Abused: No    FAMILY HISTORY: Family History  Problem Relation Age of Onset   Ovarian cancer Sister     ALLERGIES:  is allergic to atorvastatin, simvastatin, and sulfa antibiotics.  MEDICATIONS:  Current Outpatient Medications  Medication Sig Dispense Refill   anastrozole (ARIMIDEX) 1 MG tablet Take 1 tablet (1 mg total) by mouth daily. 90 tablet 3   Ascorbic Acid (VITAMIN C) 100 MG CHEW Chew 100 mg by mouth daily.     atenolol (TENORMIN) 25 MG tablet Take 1 tablet (25 mg total) by mouth daily. 90 tablet 3   atenolol (TENORMIN) 25 MG tablet Take 1 tablet (25 mg total) by mouth daily. 90 tablet 1   cetirizine (ZYRTEC) 10 MG tablet TAKE 1 TABLET BY MOUTH ONCE A DAY 90 90 tablet 3   Cholecalciferol (VITAMIN D) 50 MCG (2000 UT) tablet Take 2,000 Units by mouth daily.     Coenzyme Q10 (CO Q-10) 100 MG CAPS Take 100 mg by mouth daily.     cyclobenzaprine (FLEXERIL) 10 MG tablet Take 1 tablet (10 mg total) by mouth at bedtime as needed once a day for headache or spasm 90 tablet 1   cyclobenzaprine (FLEXERIL) 10 MG tablet Take 1 tablet (10 mg total) by mouth at bedtime as needed  for muscle spasm 30 tablet 1   Elderberry 500 MG CAPS Take 500 mg by mouth daily.     ezetimibe (ZETIA) 10 MG tablet Take 1 tablet (10 mg total) by mouth daily. 90 tablet 3   hydrochlorothiazide (MICROZIDE) 12.5 MG capsule Take 1 capsule by mouth in the morning Once a day 90 capsule 1   hydrochlorothiazide (MICROZIDE) 12.5 MG capsule Take 1 capsule (12.5 mg total) by mouth daily. 90 capsule 3   Multiple Vitamins-Minerals (HAIR/SKIN/NAILS) TABS Take 3 tablets by mouth daily.     Omega-3 Fatty Acids (FISH OIL) 1000 MG CAPS Take 2,000 mg by mouth daily.     omeprazole (PRILOSEC) 40 MG capsule Take 1 capsule by mouth twice daily. 60 capsule 3   omeprazole (PRILOSEC) 40 MG capsule  Take 1 capsule (40 mg total) by mouth 2 (two) times daily 180 capsule 1   Red Yeast Rice 600 MG CAPS Take 1,200 mg by mouth daily.     warfarin (COUMADIN) 4 MG tablet TAKE 2 TABLETS BY MOUTH DAILY MONDAY- FRIDAY, AND 1 TABLET DAILY SATURDAY AND SUNDAY AS DIRECTED. 145 tablet 5   warfarin (COUMADIN) 4 MG tablet TAKE 2 TABLETS BY MOUTH DAILY MONDAY- FRIDAY, AND 1 TABLET DAILY SATURDAY AND SUNDAY AS DIRECTED. 156 tablet 1   warfarin (COUMADIN) 4 MG tablet Take 2 tablets (8 mg total) by mouth daily Monday-Friday AND 1 tablet daily on Saturday and Sunday 156 tablet 3   Zinc 100 MG TABS Take 100 mg by mouth daily.     No current facility-administered medications for this visit.    REVIEW OF SYSTEMS:   Constitutional: Denies fevers, chills or abnormal night sweats Eyes: Denies blurriness of vision, double vision or watery eyes Ears, nose, mouth, throat, and face: Denies mucositis or sore throat Respiratory: Denies cough, dyspnea or wheezes Cardiovascular: Denies palpitation, chest discomfort or lower extremity swelling Gastrointestinal:  Denies nausea, heartburn or change in bowel habits Skin: Denies abnormal skin rashes Lymphatics: Denies new lymphadenopathy or easy bruising Neurological:Denies numbness, tingling or new  weaknesses Behavioral/Psych: Mood is stable, no new changes  Breast: Denies any palpable lumps or discharge All other systems were reviewed with the patient and are negative.  PHYSICAL EXAMINATION: ECOG PERFORMANCE STATUS: 0 - Asymptomatic  Vitals:   11/29/22 0900  BP: (!) 143/78  Pulse: 62  Resp: 19  Temp: 97.9 F (36.6 C)  SpO2: 99%    Filed Weights   11/29/22 0900  Weight: 179 lb 6.4 oz (81.4 kg)     Physical Exam Constitutional:      Appearance: Normal appearance.  Chest:     Comments: Right breast status post adjuvant radiation, healing well Musculoskeletal:        General: Normal range of motion.     Cervical back: Normal range of motion and neck supple. No rigidity.  Lymphadenopathy:     Cervical: No cervical adenopathy.  Skin:    General: Skin is warm and dry.  Neurological:     Mental Status: She is alert.      LABORATORY DATA:  I have reviewed the data as listed Lab Results  Component Value Date   WBC 7.9 11/23/2021   HGB 13.1 11/23/2021   HCT 40.3 11/23/2021   MCV 93.9 11/23/2021   PLT 286 11/23/2021   Lab Results  Component Value Date   NA 139 11/23/2021   K 3.6 11/23/2021   CL 104 11/23/2021   CO2 29 11/23/2021    RADIOGRAPHIC STUDIES: I have personally reviewed the radiological reports and agreed with the findings in the report.  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 She is now here after completing adjuvant radiation. She is now on adjuvant anastrozole ( history of DVT, hence we didn't choose tamoxifen) She is tolerating this well except for mild hot flashes.  Bilateral breast inspected, excellent  cosmetic outcome, no palpable masses or regional adenopathy, posttreatment changes noted in the right breast.  Her last mammogram was without any evidence of malignancy back in  June 2024.  Baseline bone density is normal.  I have encouraged weightbearing exercises, calcium and vitamin D supplementation, compliance to anastrozole and follow-up in approximately 6 months or sooner as needed.  She expressed understanding    Total time spent: 30 minutes including history, physical exam, review of records, counseling and coordination of care All questions were answered. The patient knows to call the clinic with any problems, questions or concerns.    Rachel Moulds, MD 11/29/22

## 2022-11-29 NOTE — Assessment & Plan Note (Addendum)
This is a very pleasant 67 year old female patient with a newly diagnosed right breast DCIS status postlumpectomy, prognostic showed ER 10% positive strong staining PR 5% positive strong staining, will be starting adjuvant radiation soon referred to medical oncology for recommendations. She is now here after completing adjuvant radiation. She is now on adjuvant anastrozole ( history of DVT, hence we didn't choose tamoxifen) She is tolerating this well except for mild hot flashes.  Bilateral breast inspected, excellent cosmetic outcome, no palpable masses or regional adenopathy, posttreatment changes noted in the right breast.  Her last mammogram was without any evidence of malignancy back in June 2024.  Baseline bone density is normal.  I have encouraged weightbearing exercises, calcium and vitamin D supplementation, compliance to anastrozole and follow-up in approximately 6 months or sooner as needed.  She expressed understanding

## 2022-12-04 ENCOUNTER — Other Ambulatory Visit (HOSPITAL_COMMUNITY): Payer: Self-pay

## 2022-12-26 DIAGNOSIS — Z7901 Long term (current) use of anticoagulants: Secondary | ICD-10-CM | POA: Diagnosis not present

## 2023-01-15 ENCOUNTER — Other Ambulatory Visit (HOSPITAL_COMMUNITY): Payer: Self-pay

## 2023-01-16 ENCOUNTER — Other Ambulatory Visit: Payer: Self-pay

## 2023-01-16 ENCOUNTER — Other Ambulatory Visit (HOSPITAL_COMMUNITY): Payer: Self-pay

## 2023-01-16 MED ORDER — OMEPRAZOLE 40 MG PO CPDR
40.0000 mg | DELAYED_RELEASE_CAPSULE | Freq: Two times a day (BID) | ORAL | 1 refills | Status: DC
  Filled 2023-01-16 – 2023-04-19 (×2): qty 180, 90d supply, fill #0
  Filled 2023-07-22: qty 180, 90d supply, fill #1

## 2023-01-17 ENCOUNTER — Other Ambulatory Visit: Payer: Self-pay

## 2023-01-17 ENCOUNTER — Other Ambulatory Visit (HOSPITAL_COMMUNITY): Payer: Self-pay

## 2023-01-24 DIAGNOSIS — Z7901 Long term (current) use of anticoagulants: Secondary | ICD-10-CM | POA: Diagnosis not present

## 2023-01-24 DIAGNOSIS — Z23 Encounter for immunization: Secondary | ICD-10-CM | POA: Diagnosis not present

## 2023-01-31 DIAGNOSIS — Z7901 Long term (current) use of anticoagulants: Secondary | ICD-10-CM | POA: Diagnosis not present

## 2023-02-18 DIAGNOSIS — H18513 Endothelial corneal dystrophy, bilateral: Secondary | ICD-10-CM | POA: Diagnosis not present

## 2023-02-18 DIAGNOSIS — Z0100 Encounter for examination of eyes and vision without abnormal findings: Secondary | ICD-10-CM | POA: Diagnosis not present

## 2023-02-18 DIAGNOSIS — H2513 Age-related nuclear cataract, bilateral: Secondary | ICD-10-CM | POA: Diagnosis not present

## 2023-02-26 ENCOUNTER — Other Ambulatory Visit (HOSPITAL_COMMUNITY): Payer: Self-pay

## 2023-02-27 ENCOUNTER — Other Ambulatory Visit (HOSPITAL_COMMUNITY): Payer: Self-pay

## 2023-02-27 DIAGNOSIS — Z7901 Long term (current) use of anticoagulants: Secondary | ICD-10-CM | POA: Diagnosis not present

## 2023-02-27 MED ORDER — ATENOLOL 25 MG PO TABS
25.0000 mg | ORAL_TABLET | Freq: Every day | ORAL | 1 refills | Status: DC
  Filled 2023-02-27: qty 90, 90d supply, fill #0
  Filled 2023-06-01: qty 90, 90d supply, fill #1

## 2023-03-08 DIAGNOSIS — Z170 Estrogen receptor positive status [ER+]: Secondary | ICD-10-CM | POA: Diagnosis not present

## 2023-03-08 DIAGNOSIS — C50411 Malignant neoplasm of upper-outer quadrant of right female breast: Secondary | ICD-10-CM | POA: Diagnosis not present

## 2023-03-21 ENCOUNTER — Other Ambulatory Visit (HOSPITAL_COMMUNITY): Payer: Self-pay

## 2023-03-21 DIAGNOSIS — Z7901 Long term (current) use of anticoagulants: Secondary | ICD-10-CM | POA: Diagnosis not present

## 2023-03-21 DIAGNOSIS — Z0000 Encounter for general adult medical examination without abnormal findings: Secondary | ICD-10-CM | POA: Diagnosis not present

## 2023-03-21 DIAGNOSIS — I129 Hypertensive chronic kidney disease with stage 1 through stage 4 chronic kidney disease, or unspecified chronic kidney disease: Secondary | ICD-10-CM | POA: Diagnosis not present

## 2023-03-21 DIAGNOSIS — Z86711 Personal history of pulmonary embolism: Secondary | ICD-10-CM | POA: Diagnosis not present

## 2023-03-21 DIAGNOSIS — E7800 Pure hypercholesterolemia, unspecified: Secondary | ICD-10-CM | POA: Diagnosis not present

## 2023-03-21 DIAGNOSIS — N183 Chronic kidney disease, stage 3 unspecified: Secondary | ICD-10-CM | POA: Diagnosis not present

## 2023-03-21 DIAGNOSIS — G720 Drug-induced myopathy: Secondary | ICD-10-CM | POA: Diagnosis not present

## 2023-03-21 DIAGNOSIS — D6851 Activated protein C resistance: Secondary | ICD-10-CM | POA: Diagnosis not present

## 2023-03-21 DIAGNOSIS — Z1159 Encounter for screening for other viral diseases: Secondary | ICD-10-CM | POA: Diagnosis not present

## 2023-03-21 DIAGNOSIS — C50411 Malignant neoplasm of upper-outer quadrant of right female breast: Secondary | ICD-10-CM | POA: Diagnosis not present

## 2023-03-21 MED ORDER — CYCLOBENZAPRINE HCL 10 MG PO TABS
10.0000 mg | ORAL_TABLET | Freq: Every evening | ORAL | 1 refills | Status: AC | PRN
  Filled 2023-03-21: qty 30, 30d supply, fill #0

## 2023-04-18 DIAGNOSIS — Z7901 Long term (current) use of anticoagulants: Secondary | ICD-10-CM | POA: Diagnosis not present

## 2023-04-19 ENCOUNTER — Other Ambulatory Visit (HOSPITAL_COMMUNITY): Payer: Self-pay

## 2023-04-19 ENCOUNTER — Other Ambulatory Visit: Payer: Self-pay

## 2023-05-21 DIAGNOSIS — Z7901 Long term (current) use of anticoagulants: Secondary | ICD-10-CM | POA: Diagnosis not present

## 2023-05-29 NOTE — Progress Notes (Unsigned)
 [facility] CONSULT NOTE  Patient Care Team: Ollen Bowl, MD as PCP - General (Internal Medicine) Rachel Moulds, MD as Consulting Physician (Hematology and Oncology) Dorothy Puffer, MD as Consulting Physician (Radiation Oncology) Almond Lint, MD as Consulting Physician (General Surgery)  CHIEF COMPLAINTS/PURPOSE OF CONSULTATION:  Newly diagnosed breast cancer  HISTORY OF PRESENTING ILLNESS:  Amber Jackson 68 y.o. female is here because of recent diagnosis of right breast DCIS  I reviewed her records extensively and collaborated the history with the patient.  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   Diagnostic mammogram showed indeterminate loosely grouped calcifications measuring up to 2.2 cm in extent.  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 Blood clots run in the family, mom and son had it. Daughter is 51, son is 39.   She completed radiation from 01/08/2022 through  02/02/2022. She started anastrozole 03/02/2022.  Discussed the use of AI scribe software for clinical note transcription with the patient, who gave verbal consent to proceed.  History of Present Illness         Amber Jackson is a 68 year old female with breast cancer who presents for routine follow-up.  She is on anastrozole for breast cancer management and experiences persistent breast tenderness, which she acknowledges is expected. No changes in her breast or swelling in her legs are noted.  She experiences minor hot flashes, which occur frequently, but no other side effects from anastrozole are reported.  In June 2024, she underwent a mammogram and bone density test, both yielding favorable results. She takes vitamin D and is considering increasing her physical activity, particularly walking.  She is actively involved in caring for her 52 year old mother with dementia, with some assistance from her daughter who lives at a distance.  She is up-to-date with age-appropriate cancer screening.  Rest of the pertinent 10 point ROS reviewed and negative  MEDICAL HISTORY:  Past Medical History:  Diagnosis Date   Breast cancer (HCC) 2023   DVT (deep venous thrombosis) (HCC)    right leg per patient   GERD (gastroesophageal reflux disease)    Heart murmur    pt had ECHO in 2010, no recent issues with this   Hypercholesterolemia    Hypertension    Personal history of radiation therapy    PONV (postoperative nausea and vomiting)    after appendectomy    SURGICAL HISTORY: Past Surgical History:  Procedure Laterality Date   APPENDECTOMY     "years ago"   BREAST LUMPECTOMY  BREAST LUMPECTOMY WITH RADIOACTIVE SEED LOCALIZATION Right 11/30/2021   Procedure: RIGHT BREAST RADIOACTIVE SEED LOCALIZED LUMPECTOMY;  Surgeon: Almond Lint, MD;  Location: [facility] OR;  Service: General;  Laterality: Right;   COLONOSCOPY      SOCIAL HISTORY: Social History   Socioeconomic History   Marital  status: Married    Spouse name: Not on file   Number of children: Not on file   Years of education: Not on file   Highest education level: Not on file  Occupational History   Not on file  Tobacco Use   Smoking status: Never   Smokeless tobacco: Never  Vaping Use   Vaping status: Never Used  Substance and Sexual Activity   Alcohol use: Never   Drug use: Never   Sexual activity: Not on file  Other Topics Concern   Not on file  Social History Narrative   Not on file   Social Drivers of Health   Financial Resource Strain: Low Risk  (01/01/2022)   Overall Financial Resource Strain (CARDIA)    Difficulty of Paying Living Expenses: Not very hard  Food Insecurity: No Food Insecurity (01/01/2022)   Hunger Vital Sign    Worried About Running Out of Food in the Last Year: Never true    Ran Out of Food in the Last Year: Never true  Transportation Needs: No Transportation Needs (01/01/2022)   PRAPARE - Administrator, Civil Service (Medical): No    Lack of Transportation (Non-Medical): No  Physical Activity: Not on file  Stress: Not on file  Social Connections: Not on file  Intimate Partner Violence: Not At Risk (12/19/2021)   Humiliation, Afraid, Rape, and Kick questionnaire    Fear of Current or Ex-Partner: No    Emotionally Abused: No    Physically Abused: No    Sexually Abused: No    FAMILY HISTORY: Family History  Problem Relation Age of Onset   Ovarian cancer Sister     ALLERGIES:  is allergic to atorvastatin, simvastatin, and sulfa antibiotics.  MEDICATIONS:  Current Outpatient Medications  Medication Sig Dispense Refill   anastrozole (ARIMIDEX) 1 MG tablet Take 1 tablet (1 mg total) by mouth daily. 90 tablet 3   Ascorbic Acid (VITAMIN C) 100 MG CHEW Chew 100 mg by mouth daily.     atenolol (TENORMIN) 25 MG tablet Take 1 tablet (25 mg total) by mouth daily. 90 tablet 3   atenolol (TENORMIN) 25 MG tablet Take 1 tablet (25 mg total) by mouth daily. 90 tablet  1   cetirizine (ZYRTEC) 10 MG tablet TAKE 1 TABLET BY MOUTH ONCE A DAY 90 90 tablet 3   Cholecalciferol (VITAMIN D) 50 MCG (2000 UT) tablet Take 2,000 Units by mouth daily.     Coenzyme Q10 (CO Q-10) 100 MG CAPS Take 100 mg by mouth daily.     cyclobenzaprine (FLEXERIL) 10 MG tablet Take 1 tablet (10 mg total) by mouth at bedtime as needed once a day for headache or spasm 90 tablet 1   cyclobenzaprine (FLEXERIL) 10 MG tablet Take 1 tablet (10 mg total) by mouth at bedtime as needed for muscle spasm 30 tablet 1   cyclobenzaprine (FLEXERIL) 10 MG tablet Take 1 tablet (10 mg total) by mouth at bedtime as needed for muscle spasms. 30 tablet 1   Elderberry 500 MG CAPS Take 500 mg by mouth daily.     ezetimibe (ZETIA) 10 MG tablet Take 1 tablet (10 mg total) by mouth daily. 90 tablet  3   hydrochlorothiazide (MICROZIDE) 12.5 MG capsule Take 1 capsule by mouth in the morning Once a day 90 capsule 1   hydrochlorothiazide (MICROZIDE) 12.5 MG capsule Take 1 capsule (12.5 mg total) by mouth daily. 90 capsule 3   Multiple Vitamins-Minerals (HAIR/SKIN/NAILS) TABS Take 3 tablets by mouth daily.     Omega-3 Fatty Acids (FISH OIL) 1000 MG CAPS Take 2,000 mg by mouth daily.     omeprazole (PRILOSEC) 40 MG capsule Take 1 capsule by mouth twice daily. 60 capsule 3   omeprazole (PRILOSEC) 40 MG capsule Take 1 capsule (40 mg total) by mouth 2 (two) times daily. 180 capsule 1   Red Yeast Rice 600 MG CAPS Take 1,200 mg by mouth daily.     warfarin (COUMADIN) 4 MG tablet TAKE 2 TABLETS BY MOUTH DAILY MONDAY- FRIDAY, AND 1 TABLET DAILY SATURDAY AND SUNDAY AS DIRECTED. 145 tablet 5   warfarin (COUMADIN) 4 MG tablet TAKE 2 TABLETS BY MOUTH DAILY MONDAY- FRIDAY, AND 1 TABLET DAILY SATURDAY AND SUNDAY AS DIRECTED. 156 tablet 1   warfarin (COUMADIN) 4 MG tablet Take 2 tablets (8 mg total) by mouth daily Monday-Friday AND 1 tablet daily on Saturday and Sunday 156 tablet 3   Zinc 100 MG TABS Take 100 mg by mouth daily.     No  current facility-administered medications for this visit.    REVIEW OF SYSTEMS:   Constitutional: Denies fevers, chills or abnormal night sweats Eyes: Denies blurriness of vision, double vision or watery eyes Ears, nose, mouth, throat, and face: Denies mucositis or sore throat Respiratory: Denies cough, dyspnea or wheezes Cardiovascular: Denies palpitation, chest discomfort or lower extremity swelling Gastrointestinal:  Denies nausea, heartburn or change in bowel habits Skin: Denies abnormal skin rashes Lymphatics: Denies new lymphadenopathy or easy bruising Neurological:Denies numbness, tingling or new weaknesses Behavioral/Psych: Mood is stable, no new changes  Breast: Denies any palpable lumps or discharge All other systems were reviewed with the patient and are negative.  PHYSICAL EXAMINATION: ECOG PERFORMANCE STATUS: 0 - Asymptomatic  Vitals:   05/30/23 0913  BP: (!) 161/95  Pulse: 67  Resp: 18  Temp: (!) 97.3 F (36.3 C)  SpO2: 97%     Filed Weights   05/30/23 0913  Weight: 181 lb 3.2 oz (82.2 kg)      Physical Exam Constitutional:      Appearance: Normal appearance.  Chest:     Comments: Right breast s/p treatment changes. No palpable masses or regional adenopathy. Left breast normal. Musculoskeletal:        General: Normal range of motion.     Cervical back: Normal range of motion and neck supple. No rigidity.  Lymphadenopathy:     Cervical: No cervical adenopathy.  Skin:    General: Skin is warm and dry.  Neurological:     Mental Status: She is alert.      LABORATORY DATA:  I have reviewed the data as listed Lab Results  Component Value Date   WBC 7.9 11/23/2021   HGB 13.1 11/23/2021   HCT 40.3 11/23/2021   MCV 93.9 11/23/2021   PLT 286 11/23/2021   Lab Results  Component Value Date   NA 139 11/23/2021   K 3.6 11/23/2021   CL 104 11/23/2021   CO2 29 11/23/2021    RADIOGRAPHIC STUDIES: I have personally reviewed the radiological  reports and agreed with the findings in the report.  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  She is now on adjuvant anastrozole ( history of DVT, hence we didn't choose tamoxifen). Most recent mammogram neg for malignancy.  Breast Cancer On Anastrozole with minor hot flashes as side effect. No changes in breasts or axillary lymph nodes noted on examination. -Continue Anastrozole. -Continue regular follow-ups with breast surgeon. -Mammogram due in June 2025.  Bone Health Recent bone density scan in June showed good bone health. Patient is taking Vitamin D. -Continue Vitamin D. -Encourage regular walking for bone health.  General Health Maintenance -Next follow-up appointment in one year (around February 2026). -Coordinate with breast surgeon for alternating visits.   Total time spent: 30 minutes including history, physical exam, review of records, counseling and coordination of care All questions were answered. The patient knows to call the clinic with any problems, questions or concerns.    Rachel Moulds, MD 05/30/23

## 2023-05-29 NOTE — Assessment & Plan Note (Addendum)
 This is a very pleasant 68 year old female patient with a newly diagnosed right breast DCIS status postlumpectomy, prognostic showed ER 10% positive strong staining PR 5% positive strong staining, will be starting adjuvant radiation soon referred to medical oncology for recommendations.  She is now on adjuvant anastrozole ( history of DVT, hence we didn't choose tamoxifen). Most recent mammogram neg for malignancy.  Breast Cancer On Anastrozole with minor hot flashes as side effect. No changes in breasts or axillary lymph nodes noted on examination. -Continue Anastrozole. -Continue regular follow-ups with breast surgeon. -Mammogram due in June 2025.  Bone Health Recent bone density scan in June showed good bone health. Patient is taking Vitamin D. -Continue Vitamin D. -Encourage regular walking for bone health.  General Health Maintenance -Next follow-up appointment in one year (around February 2026). -Coordinate with breast surgeon for alternating visits.

## 2023-05-30 ENCOUNTER — Encounter: Payer: Self-pay | Admitting: Hematology and Oncology

## 2023-05-30 ENCOUNTER — Inpatient Hospital Stay: Payer: Medicare Other | Attending: Hematology and Oncology | Admitting: Hematology and Oncology

## 2023-05-30 VITALS — BP 161/95 | HR 67 | Temp 97.3°F | Resp 18 | Wt 181.2 lb

## 2023-05-30 DIAGNOSIS — Z86718 Personal history of other venous thrombosis and embolism: Secondary | ICD-10-CM | POA: Diagnosis not present

## 2023-05-30 DIAGNOSIS — Z8041 Family history of malignant neoplasm of ovary: Secondary | ICD-10-CM | POA: Insufficient documentation

## 2023-05-30 DIAGNOSIS — Z170 Estrogen receptor positive status [ER+]: Secondary | ICD-10-CM | POA: Insufficient documentation

## 2023-05-30 DIAGNOSIS — Z1721 Progesterone receptor positive status: Secondary | ICD-10-CM | POA: Diagnosis not present

## 2023-05-30 DIAGNOSIS — D0511 Intraductal carcinoma in situ of right breast: Secondary | ICD-10-CM | POA: Diagnosis not present

## 2023-05-30 DIAGNOSIS — Z79811 Long term (current) use of aromatase inhibitors: Secondary | ICD-10-CM | POA: Insufficient documentation

## 2023-05-30 DIAGNOSIS — Z923 Personal history of irradiation: Secondary | ICD-10-CM | POA: Insufficient documentation

## 2023-05-30 DIAGNOSIS — R232 Flushing: Secondary | ICD-10-CM | POA: Diagnosis not present

## 2023-06-03 ENCOUNTER — Other Ambulatory Visit (HOSPITAL_COMMUNITY): Payer: Self-pay

## 2023-06-18 DIAGNOSIS — Z7901 Long term (current) use of anticoagulants: Secondary | ICD-10-CM | POA: Diagnosis not present

## 2023-06-25 DIAGNOSIS — Z7901 Long term (current) use of anticoagulants: Secondary | ICD-10-CM | POA: Diagnosis not present

## 2023-07-22 ENCOUNTER — Other Ambulatory Visit (HOSPITAL_COMMUNITY): Payer: Self-pay

## 2023-07-23 ENCOUNTER — Other Ambulatory Visit (HOSPITAL_COMMUNITY): Payer: Self-pay

## 2023-07-23 DIAGNOSIS — Z7901 Long term (current) use of anticoagulants: Secondary | ICD-10-CM | POA: Diagnosis not present

## 2023-08-20 DIAGNOSIS — Z7901 Long term (current) use of anticoagulants: Secondary | ICD-10-CM | POA: Diagnosis not present

## 2023-09-05 ENCOUNTER — Other Ambulatory Visit (HOSPITAL_COMMUNITY): Payer: Self-pay

## 2023-09-05 MED ORDER — ATENOLOL 25 MG PO TABS
25.0000 mg | ORAL_TABLET | Freq: Every day | ORAL | 0 refills | Status: DC
  Filled 2023-09-05: qty 90, 90d supply, fill #0

## 2023-09-19 ENCOUNTER — Other Ambulatory Visit (HOSPITAL_COMMUNITY): Payer: Self-pay

## 2023-09-19 DIAGNOSIS — I129 Hypertensive chronic kidney disease with stage 1 through stage 4 chronic kidney disease, or unspecified chronic kidney disease: Secondary | ICD-10-CM | POA: Diagnosis not present

## 2023-09-19 DIAGNOSIS — Z7901 Long term (current) use of anticoagulants: Secondary | ICD-10-CM | POA: Diagnosis not present

## 2023-09-19 DIAGNOSIS — K219 Gastro-esophageal reflux disease without esophagitis: Secondary | ICD-10-CM | POA: Diagnosis not present

## 2023-09-19 DIAGNOSIS — M5432 Sciatica, left side: Secondary | ICD-10-CM | POA: Diagnosis not present

## 2023-09-19 MED ORDER — CYCLOBENZAPRINE HCL 10 MG PO TABS
10.0000 mg | ORAL_TABLET | Freq: Every evening | ORAL | 1 refills | Status: AC | PRN
  Filled 2023-09-19: qty 30, 30d supply, fill #0

## 2023-09-23 ENCOUNTER — Ambulatory Visit
Admission: RE | Admit: 2023-09-23 | Discharge: 2023-09-23 | Disposition: A | Discharge status: Home / Self Care | Payer: Medicare Other | Source: Ambulatory Visit | Attending: Hematology and Oncology

## 2023-09-23 DIAGNOSIS — D0511 Intraductal carcinoma in situ of right breast: Secondary | ICD-10-CM

## 2023-09-23 DIAGNOSIS — Z08 Encounter for follow-up examination after completed treatment for malignant neoplasm: Secondary | ICD-10-CM | POA: Diagnosis not present

## 2023-09-23 DIAGNOSIS — Z853 Personal history of malignant neoplasm of breast: Secondary | ICD-10-CM | POA: Diagnosis not present

## 2023-10-16 ENCOUNTER — Other Ambulatory Visit (HOSPITAL_COMMUNITY): Payer: Self-pay

## 2023-10-16 MED ORDER — WARFARIN SODIUM 4 MG PO TABS
ORAL_TABLET | ORAL | 3 refills | Status: AC
  Filled 2023-10-16: qty 156, 90d supply, fill #0
  Filled 2024-01-08: qty 156, 90d supply, fill #1
  Filled 2024-04-21: qty 156, 90d supply, fill #2

## 2023-10-16 MED ORDER — HYDROCHLOROTHIAZIDE 12.5 MG PO CAPS
12.5000 mg | ORAL_CAPSULE | Freq: Every day | ORAL | 3 refills | Status: AC
  Filled 2023-10-16 (×2): qty 90, 90d supply, fill #0
  Filled 2024-01-17: qty 30, 30d supply, fill #1
  Filled 2024-01-17: qty 60, 60d supply, fill #1
  Filled 2024-04-21: qty 90, 90d supply, fill #2

## 2023-10-16 MED ORDER — EZETIMIBE 10 MG PO TABS
10.0000 mg | ORAL_TABLET | Freq: Every day | ORAL | 3 refills | Status: AC
  Filled 2023-10-16: qty 90, 90d supply, fill #0
  Filled 2024-01-17: qty 90, 90d supply, fill #1
  Filled 2024-04-21: qty 90, 90d supply, fill #2

## 2023-10-17 DIAGNOSIS — Z7901 Long term (current) use of anticoagulants: Secondary | ICD-10-CM | POA: Diagnosis not present

## 2023-11-14 DIAGNOSIS — Z7901 Long term (current) use of anticoagulants: Secondary | ICD-10-CM | POA: Diagnosis not present

## 2023-12-06 ENCOUNTER — Other Ambulatory Visit: Payer: Self-pay | Admitting: Hematology and Oncology

## 2023-12-06 ENCOUNTER — Other Ambulatory Visit (HOSPITAL_COMMUNITY): Payer: Self-pay

## 2023-12-06 MED ORDER — ATENOLOL 25 MG PO TABS
25.0000 mg | ORAL_TABLET | Freq: Every day | ORAL | 1 refills | Status: AC
  Filled 2023-12-06: qty 90, 90d supply, fill #0
  Filled 2024-03-03: qty 90, 90d supply, fill #1

## 2023-12-06 MED ORDER — ANASTROZOLE 1 MG PO TABS
1.0000 mg | ORAL_TABLET | Freq: Every day | ORAL | 3 refills | Status: AC
  Filled 2023-12-06: qty 90, 90d supply, fill #0
  Filled 2024-03-03: qty 90, 90d supply, fill #1

## 2023-12-18 DIAGNOSIS — Z7901 Long term (current) use of anticoagulants: Secondary | ICD-10-CM | POA: Diagnosis not present

## 2024-01-08 ENCOUNTER — Other Ambulatory Visit (HOSPITAL_COMMUNITY): Payer: Self-pay

## 2024-01-17 ENCOUNTER — Other Ambulatory Visit (HOSPITAL_COMMUNITY): Payer: Self-pay

## 2024-01-20 DIAGNOSIS — Z7901 Long term (current) use of anticoagulants: Secondary | ICD-10-CM | POA: Diagnosis not present

## 2024-01-20 DIAGNOSIS — Z23 Encounter for immunization: Secondary | ICD-10-CM | POA: Diagnosis not present

## 2024-03-03 ENCOUNTER — Other Ambulatory Visit (HOSPITAL_COMMUNITY): Payer: Self-pay

## 2024-03-17 ENCOUNTER — Telehealth: Payer: Self-pay | Admitting: Hematology and Oncology

## 2024-03-17 NOTE — Telephone Encounter (Signed)
 I spoke with the pt regarding 06/01/24 appt being rescheduled to 06/04/24

## 2024-03-24 ENCOUNTER — Other Ambulatory Visit (HOSPITAL_COMMUNITY): Payer: Self-pay

## 2024-03-24 MED ORDER — EZETIMIBE 10 MG PO TABS: 10.0000 mg | ORAL_TABLET | Freq: Every day | ORAL | 1 refills | Status: AC

## 2024-03-24 MED ORDER — WARFARIN SODIUM 4 MG PO TABS: 4.0000 mg | ORAL_TABLET | Freq: Every day | ORAL | 3 refills | Status: AC

## 2024-04-21 ENCOUNTER — Other Ambulatory Visit (HOSPITAL_COMMUNITY): Payer: Self-pay

## 2024-04-21 MED ORDER — OMEPRAZOLE 40 MG PO CPDR
40.0000 mg | DELAYED_RELEASE_CAPSULE | Freq: Two times a day (BID) | ORAL | 1 refills | Status: AC
  Filled 2024-04-21: qty 180, 90d supply, fill #0

## 2024-06-01 ENCOUNTER — Ambulatory Visit: Payer: Medicare Other | Admitting: Hematology and Oncology

## 2024-06-04 ENCOUNTER — Inpatient Hospital Stay: Admitting: Hematology and Oncology
